# Patient Record
Sex: Male | Born: 1975
Health system: Southern US, Community
[De-identification: ages and names within clinical notes are randomized; demographics above are authoritative.]

## PROBLEM LIST (undated history)

## (undated) DIAGNOSIS — G4730 Sleep apnea, unspecified: Secondary | ICD-10-CM

## (undated) HISTORY — PX: ANKLE SURGERY: SHX546

---

## 2000-06-18 ENCOUNTER — Emergency Department (HOSPITAL_COMMUNITY): Admission: EM | Admit: 2000-06-18 | Discharge: 2000-06-18 | Payer: Self-pay | Admitting: Emergency Medicine

## 2010-11-27 ENCOUNTER — Emergency Department (HOSPITAL_COMMUNITY)
Admission: EM | Admit: 2010-11-27 | Discharge: 2010-11-28 | Payer: Self-pay | Source: Home / Self Care | Admitting: Emergency Medicine

## 2011-02-19 LAB — DIFFERENTIAL
Basophils Absolute: 0 10*3/uL (ref 0.0–0.1)
Eosinophils Relative: 1 % (ref 0–5)
Lymphocytes Relative: 18 % (ref 12–46)
Neutro Abs: 9.9 10*3/uL — ABNORMAL HIGH (ref 1.7–7.7)

## 2011-02-19 LAB — URINALYSIS, ROUTINE W REFLEX MICROSCOPIC
Bilirubin Urine: NEGATIVE
Ketones, ur: NEGATIVE mg/dL
Urobilinogen, UA: 1 mg/dL (ref 0.0–1.0)

## 2011-02-19 LAB — BASIC METABOLIC PANEL
BUN: 17 mg/dL (ref 6–23)
CO2: 24 mEq/L (ref 19–32)
Creatinine, Ser: 1.24 mg/dL (ref 0.4–1.5)
GFR calc Af Amer: >60 mL/min (ref >60)
Glucose, Bld: 98 mg/dL (ref 70–99)
Potassium: 3.8 mEq/L (ref 3.5–5.1)
Sodium: 135 mEq/L (ref 135–145)

## 2011-02-19 LAB — CBC
HCT: 47 % (ref 39.0–52.0)
MCHC: 35.3 g/dL (ref 30.0–36.0)
RDW: 12.4 % (ref 11.5–15.5)

## 2013-09-01 ENCOUNTER — Encounter (HOSPITAL_BASED_OUTPATIENT_CLINIC_OR_DEPARTMENT_OTHER): Payer: Self-pay | Admitting: *Deleted

## 2013-09-01 ENCOUNTER — Emergency Department (HOSPITAL_BASED_OUTPATIENT_CLINIC_OR_DEPARTMENT_OTHER)
Admission: EM | Admit: 2013-09-01 | Discharge: 2013-09-01 | Disposition: A | Discharge status: Home / Self Care | Payer: No Typology Code available for payment source | Attending: Emergency Medicine | Admitting: Emergency Medicine

## 2013-09-01 DIAGNOSIS — S336XXA Sprain of sacroiliac joint, initial encounter: Secondary | ICD-10-CM | POA: Insufficient documentation

## 2013-09-01 DIAGNOSIS — S139XXA Sprain of joints and ligaments of unspecified parts of neck, initial encounter: Secondary | ICD-10-CM | POA: Insufficient documentation

## 2013-09-01 DIAGNOSIS — F17200 Nicotine dependence, unspecified, uncomplicated: Secondary | ICD-10-CM | POA: Insufficient documentation

## 2013-09-01 DIAGNOSIS — Y92410 Unspecified street and highway as the place of occurrence of the external cause: Secondary | ICD-10-CM | POA: Insufficient documentation

## 2013-09-01 DIAGNOSIS — S161XXA Strain of muscle, fascia and tendon at neck level, initial encounter: Secondary | ICD-10-CM

## 2013-09-01 DIAGNOSIS — S39012A Strain of muscle, fascia and tendon of lower back, initial encounter: Secondary | ICD-10-CM

## 2013-09-01 DIAGNOSIS — Y9389 Activity, other specified: Secondary | ICD-10-CM | POA: Insufficient documentation

## 2013-09-01 MED ORDER — CYCLOBENZAPRINE HCL 10 MG PO TABS: 10.0000 mg | ORAL_TABLET | Freq: Two times a day (BID) | ORAL | Status: DC | PRN

## 2013-09-01 MED ORDER — IBUPROFEN 800 MG PO TABS: 800.0000 mg | ORAL_TABLET | Freq: Three times a day (TID) | ORAL | Status: DC
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
## 2013-09-15 ENCOUNTER — Encounter (HOSPITAL_BASED_OUTPATIENT_CLINIC_OR_DEPARTMENT_OTHER): Payer: Self-pay | Admitting: Emergency Medicine

## 2013-09-15 ENCOUNTER — Emergency Department (HOSPITAL_BASED_OUTPATIENT_CLINIC_OR_DEPARTMENT_OTHER): Payer: No Typology Code available for payment source

## 2013-09-15 ENCOUNTER — Emergency Department (HOSPITAL_BASED_OUTPATIENT_CLINIC_OR_DEPARTMENT_OTHER)
Admission: EM | Admit: 2013-09-15 | Discharge: 2013-09-16 | Disposition: A | Discharge status: Home / Self Care | Payer: No Typology Code available for payment source | Attending: Emergency Medicine | Admitting: Emergency Medicine

## 2013-09-15 DIAGNOSIS — M5432 Sciatica, left side: Secondary | ICD-10-CM

## 2013-09-15 DIAGNOSIS — M533 Sacrococcygeal disorders, not elsewhere classified: Secondary | ICD-10-CM | POA: Insufficient documentation

## 2013-09-15 DIAGNOSIS — F17200 Nicotine dependence, unspecified, uncomplicated: Secondary | ICD-10-CM | POA: Insufficient documentation

## 2013-09-15 DIAGNOSIS — M5430 Sciatica, unspecified side: Secondary | ICD-10-CM | POA: Insufficient documentation

## 2013-09-15 DIAGNOSIS — G8911 Acute pain due to trauma: Secondary | ICD-10-CM | POA: Insufficient documentation
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
## 2013-09-16 MED ORDER — NAPROXEN 250 MG PO TABS
ORAL_TABLET | ORAL | Status: AC
  Filled 2013-09-16: qty 2

## 2013-09-16 MED ORDER — HYDROCODONE-ACETAMINOPHEN 5-325 MG PO TABS
ORAL_TABLET | ORAL | Status: AC
  Filled 2013-09-16: qty 2
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
## 2013-09-30 ENCOUNTER — Ambulatory Visit (INDEPENDENT_AMBULATORY_CARE_PROVIDER_SITE_OTHER): Payer: Self-pay | Admitting: Family Medicine

## 2013-09-30 ENCOUNTER — Encounter: Payer: Self-pay | Admitting: Family Medicine

## 2013-09-30 VITALS — BP 175/105 | HR 102 | Ht 74.0 in | Wt 330.0 lb

## 2013-09-30 DIAGNOSIS — M545 Low back pain: Secondary | ICD-10-CM

## 2013-09-30 MED ORDER — HYDROCODONE-ACETAMINOPHEN 5-325 MG PO TABS: 1.0000 | ORAL_TABLET | Freq: Four times a day (QID) | ORAL | Status: DC | PRN

## 2013-09-30 MED ORDER — PREDNISONE (PAK) 10 MG PO TABS: ORAL_TABLET | ORAL | Status: DC
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
## 2013-10-01 ENCOUNTER — Encounter: Payer: Self-pay | Admitting: Family Medicine

## 2013-10-01 DIAGNOSIS — M545 Low back pain: Secondary | ICD-10-CM | POA: Insufficient documentation
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
## 2013-10-07 ENCOUNTER — Telehealth: Payer: Self-pay | Admitting: Family Medicine

## 2013-10-08 ENCOUNTER — Other Ambulatory Visit: Payer: Self-pay | Admitting: *Deleted

## 2013-10-08 MED ORDER — TRAMADOL HCL 50 MG PO TABS: 50.0000 mg | ORAL_TABLET | Freq: Three times a day (TID) | ORAL | Status: DC | PRN

## 2013-10-08 NOTE — Telephone Encounter (Signed)
He could try Tramadol 50mg  1 tab every 8 hours as needed for severe pain, #90 with 0 refills.

## 2013-11-11 ENCOUNTER — Ambulatory Visit (INDEPENDENT_AMBULATORY_CARE_PROVIDER_SITE_OTHER): Payer: Self-pay | Admitting: Family Medicine

## 2013-11-11 VITALS — BP 132/87 | HR 94 | Ht 74.0 in | Wt 330.0 lb

## 2013-11-11 DIAGNOSIS — M545 Low back pain: Secondary | ICD-10-CM

## 2013-11-12 ENCOUNTER — Encounter: Payer: Self-pay | Admitting: Family Medicine
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
## 2014-01-12 ENCOUNTER — Ambulatory Visit (INDEPENDENT_AMBULATORY_CARE_PROVIDER_SITE_OTHER): Payer: Self-pay | Admitting: Family Medicine

## 2014-01-12 ENCOUNTER — Encounter: Payer: Self-pay | Admitting: Family Medicine

## 2014-01-12 VITALS — BP 157/97 | HR 101 | Ht 74.0 in | Wt 330.0 lb

## 2014-01-12 DIAGNOSIS — M545 Low back pain, unspecified: Secondary | ICD-10-CM

## 2014-01-15 ENCOUNTER — Encounter: Payer: Self-pay | Admitting: Family Medicine
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
## 2017-09-03 ENCOUNTER — Emergency Department
Admission: EM | Admit: 2017-09-03 | Discharge: 2017-09-03 | Disposition: A | Discharge status: Home / Self Care | Payer: Self-pay | Attending: Emergency Medicine | Admitting: Emergency Medicine

## 2017-09-03 ENCOUNTER — Emergency Department: Payer: Self-pay

## 2017-09-03 ENCOUNTER — Encounter: Payer: Self-pay | Admitting: Medical Oncology

## 2017-09-03 ENCOUNTER — Encounter: Payer: Self-pay | Admitting: Surgery

## 2017-09-03 DIAGNOSIS — S82839A Other fracture of upper and lower end of unspecified fibula, initial encounter for closed fracture: Secondary | ICD-10-CM | POA: Insufficient documentation

## 2017-09-03 DIAGNOSIS — Z791 Long term (current) use of non-steroidal anti-inflammatories (NSAID): Secondary | ICD-10-CM | POA: Insufficient documentation

## 2017-09-03 DIAGNOSIS — Y999 Unspecified external cause status: Secondary | ICD-10-CM | POA: Insufficient documentation

## 2017-09-03 DIAGNOSIS — F17210 Nicotine dependence, cigarettes, uncomplicated: Secondary | ICD-10-CM | POA: Insufficient documentation

## 2017-09-03 DIAGNOSIS — W010XXA Fall on same level from slipping, tripping and stumbling without subsequent striking against object, initial encounter: Secondary | ICD-10-CM | POA: Insufficient documentation

## 2017-09-03 DIAGNOSIS — Y93H2 Activity, gardening and landscaping: Secondary | ICD-10-CM | POA: Insufficient documentation

## 2017-09-03 DIAGNOSIS — Y9289 Other specified places as the place of occurrence of the external cause: Secondary | ICD-10-CM | POA: Insufficient documentation

## 2017-09-03 DIAGNOSIS — S8254XA Nondisplaced fracture of medial malleolus of right tibia, initial encounter for closed fracture: Secondary | ICD-10-CM | POA: Insufficient documentation

## 2017-09-03 MED ORDER — OXYCODONE-ACETAMINOPHEN 5-325 MG PO TABS
1.0000 | ORAL_TABLET | Freq: Once | ORAL | Status: AC
  Administered 2017-09-03: 1 via ORAL
  Filled 2017-09-03: qty 1

## 2017-09-03 MED ORDER — OXYCODONE-ACETAMINOPHEN 7.5-325 MG PO TABS: 1.0000 | ORAL_TABLET | Freq: Four times a day (QID) | ORAL | 0 refills | Status: AC | PRN

## 2017-09-03 MED ORDER — FENTANYL CITRATE (PF) 100 MCG/2ML IJ SOLN: 50.0000 ug | Freq: Once | INTRAMUSCULAR | Status: DC

## 2017-09-03 NOTE — Discharge Instructions (Signed)
°  IMPRESSION:  1. Comminuted oblique fracture through the distal fibular  metaphysis.  2. Comminuted transverse fracture involving the medial malleolus.  3. Avulsion fracture arising from the anterior aspect of the distal  tibia.  4. Very slight lateral subluxation of the talus relative to the  tibial plafond to.

## 2017-09-03 NOTE — ED Triage Notes (Signed)
Pt was doing yard work when he twisted his rt ankle.
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
## 2017-09-03 NOTE — ED Provider Notes (Signed)
[facility] Emergency Department Provider Note  ____________________________________________  Time seen: Approximately 12:46 PM  I have reviewed the triage vital signs and the nursing notes.   HISTORY  Chief Complaint Ankle Pain    HPI Tyler Briggs is a 41 y.o. male that presents to the emergency department for evaluation of right ankle pain after falling today. Patient states that his foot got stuck in a hole while doing yard work and twisted. He heard a pop. He has not been able to bear weight since fall. He did not hit his head or lose consciousness. No numbness, tingling.   History reviewed. No pertinent past medical history.  Patient Active Problem List   Diagnosis Date Noted  . Low back pain 10/01/2013    No past surgical history on file.  Prior to Admission medications   Medication Sig Start Date End Date Taking? Authorizing Provider  cyclobenzaprine (FLEXERIL) 10 MG tablet Take 1 tablet (10 mg total) by mouth 2 (two) times daily as needed for muscle spasms. 09/01/13   Pricilla Loveless, MD  HYDROcodone-acetaminophen (NORCO/VICODIN) 5-325 MG per tablet Take 1 tablet by mouth every 6 (six) hours as needed for pain. 09/30/13   Hudnall, Azucena Fallen, MD  ibuprofen (ADVIL,MOTRIN) 800 MG tablet Take 1 tablet (800 mg total) by mouth 3 (three) times daily. 09/01/13   Pricilla Loveless, MD  oxyCODONE-acetaminophen (PERCOCET) 7.5-325 MG tablet Take 1 tablet by mouth every 6 (six) hours as needed for severe pain. 09/03/17 09/06/17  Enid Derry, PA-C  predniSONE (STERAPRED UNI-PAK) 10 MG tablet 6 tabs po day 1, 5 tabs po day 2, 4 tabs po day 3, 3 tabs po day 4, 2 tabs po day 5, 1 tab po day 6 09/30/13   Hudnall, Azucena Fallen, MD  traMADol (ULTRAM) 50 MG tablet Take 1 tablet (50 mg total) by mouth every 8 (eight) hours as needed for pain. 10/08/13   Lenda Kelp, MD    Allergies Patient has no known allergies.  Family History  Problem Relation Age of Onset  .  Sudden death Mother   . Heart attack Mother   . Hypertension Father   . Hyperlipidemia Neg Hx   . Diabetes Neg Hx     Social History Social History  Substance Use Topics  . Smoking status: Current Every Day Smoker    Packs/day: 1.00    Types: Cigarettes  . Smokeless tobacco: Never Used  . Alcohol use No     Review of Systems  Constitutional: No fever/chills Cardiovascular: No chest pain. Respiratory: No SOB. Gastrointestinal: No abdominal pain.  No nausea, no vomiting.  Musculoskeletal: Positive for ankle pain. Skin: Negative for rash, abrasions, lacerations, ecchymosis. Neurological: Negative for headaches, numbness or tingling   ____________________________________________   PHYSICAL EXAM:  VITAL SIGNS: ED Triage Vitals [09/03/17 1158]  Enc Vitals Group     BP 115/70     Pulse Rate 75     Resp 18     Temp 97.8 F (36.6 C)     Temp Source Oral     SpO2 99 %     Weight (!) 310 lb (140.6 kg)     Height  (1.88 m)     Head Circumference      Peak Flow      Pain Score 10     Pain Loc      Pain Edu?      Excl. in GC?      Constitutional: Alert and oriented.  Well appearing and in no acute distress. Eyes: Conjunctivae are normal. PERRL. EOMI. Head: Atraumatic. ENT:      Ears:      Nose: No congestion/rhinnorhea.      Mouth/Throat: Mucous membranes are moist.  Neck: No stridor. Cardiovascular: Normal rate, regular rhythm.  Good peripheral circulation. Palpable dorsalis pedis pulses. Respiratory: Normal respiratory effort without tachypnea or retractions. Lungs CTAB. Good air entry to the bases with no decreased or absent breath sounds. Musculoskeletal: Moderate swelling and bruising to right ankle. No range of motion of ankle due to pain. Compartments are soft. Neurologic:  Normal speech and language. No gross focal neurologic deficits are appreciated.  Skin:  Skin is warm, dry and intact. No rash  noted.   ____________________________________________   LABS (all labs ordered are listed, but only abnormal results are displayed)  Labs Reviewed - No data to display ____________________________________________  EKG   ____________________________________________  RADIOLOGY Lexine Baton, personally viewed and evaluated these images (plain radiographs) as part of my medical decision making, as well as reviewing the written report by the radiologist.  Dg Ankle Complete Right  Result Date: 09/03/2017 CLINICAL DATA:  41 year old who sustained a twisting injury to the right ankle while working in his yard at home earlier today. Initial encounter. EXAM: RIGHT ANKLE - COMPLETE 3+ VIEW COMPARISON:  None. FINDINGS: Comminuted oblique fracture through the distal fibular metaphysis. Comminuted transverse fracture involving the medial malleolus. Avulsion fracture arising from the anterior aspect of the distal tibia. No posterior malleolar fracture is visible. Very slight lateral subluxation of the talus relative to the tibial plafond. Large joint effusion/hemarthrosis. Small plantar calcaneal spur. Small enthesopathic spur at the insertion of the Achilles tendon on the calcaneus. IMPRESSION: 1. Comminuted oblique fracture through the distal fibular metaphysis. 2. Comminuted transverse fracture involving the medial malleolus. 3. Avulsion fracture arising from the anterior aspect of the distal tibia. 4. Very slight lateral subluxation of the talus relative to the tibial plafond to. Electronically Signed   By: Hulan Saas M.D.   On: 09/03/2017 12:44    ____________________________________________    PROCEDURES  Procedure(s) performed:    Procedures    Medications  oxyCODONE-acetaminophen (PERCOCET/ROXICET) 5-325 MG per tablet 1 tablet (1 tablet Oral Given 09/03/17 1428)     ____________________________________________   INITIAL IMPRESSION / ASSESSMENT AND PLAN / ED  COURSE  Pertinent labs & imaging results that were available during my care of the patient were reviewed by me and considered in my medical decision making (see chart for details).  Review of the [place] CSRS was performed in accordance of the [facility] prior to dispensing any controlled drugs.  Patient presented to the emergency department for evaluation of right ankle pain after fall. Signs and exam are reassuring. X-ray consistent with distal fibular fracture, medial malleolus fracture, anterior tibial avulsion fracture, and subluxation of talus. Foot is neurovascularly intact. Dr. Joice Lofts was consulted and recommended surgery today. Patient is refusing surgery today and talked to Dr. Joice Lofts on the phone. Dr. Joice Lofts recommended that ankle be placed in a splint and to follow up with him in clinic next week. Ankle was splinted while knee was in 90 degrees flexion and holding the great toe. Crutches were given. Patient was instructed not to bear any weight on that foot.  Patient will be discharged home with prescriptions for Percocet. Patient is to follow up with orthopedics as directed. Patient is given ED precautions to return to the ED for any worsening or new symptoms.  ____________________________________________  FINAL CLINICAL IMPRESSION(S) / ED DIAGNOSES  Final diagnoses:  Closed fracture of distal end of fibula, unspecified fracture morphology, initial encounter  Nondisplaced fracture of medial malleolus of right tibia, initial encounter for closed fracture      NEW MEDICATIONS STARTED DURING THIS VISIT:  Discharge Medication List as of 09/03/2017  3:12 PM          This chart was dictated using voice recognition software/Dragon. Despite best efforts to proofread, errors can occur which can change the meaning. Any change was purely unintentional.    Enid Derry, PA-C 09/03/17 1606    Sharyn Creamer, MD 09/03/17 [phone]

## 2017-09-03 NOTE — ED Notes (Signed)
Loreta Ave, PA in with pt at this time.

## 2017-09-03 NOTE — ED Notes (Signed)
Loreta Ave, PA has told the nurse to wait on drawing pt's blood and IV start. Pt is not wanting to have surgery done today.

## 2018-10-25 IMAGING — DX DG ANKLE COMPLETE 3+V*R*
3 series · 3 of 3 positions shown · non-contrast
Comparison: None.

CLINICAL DATA: 41-year-old who sustained a twisting injury to the
right ankle while working in his yard at home earlier today. Initial
encounter.

EXAM:
RIGHT ANKLE - COMPLETE 3+ VIEW

[ankle ap]
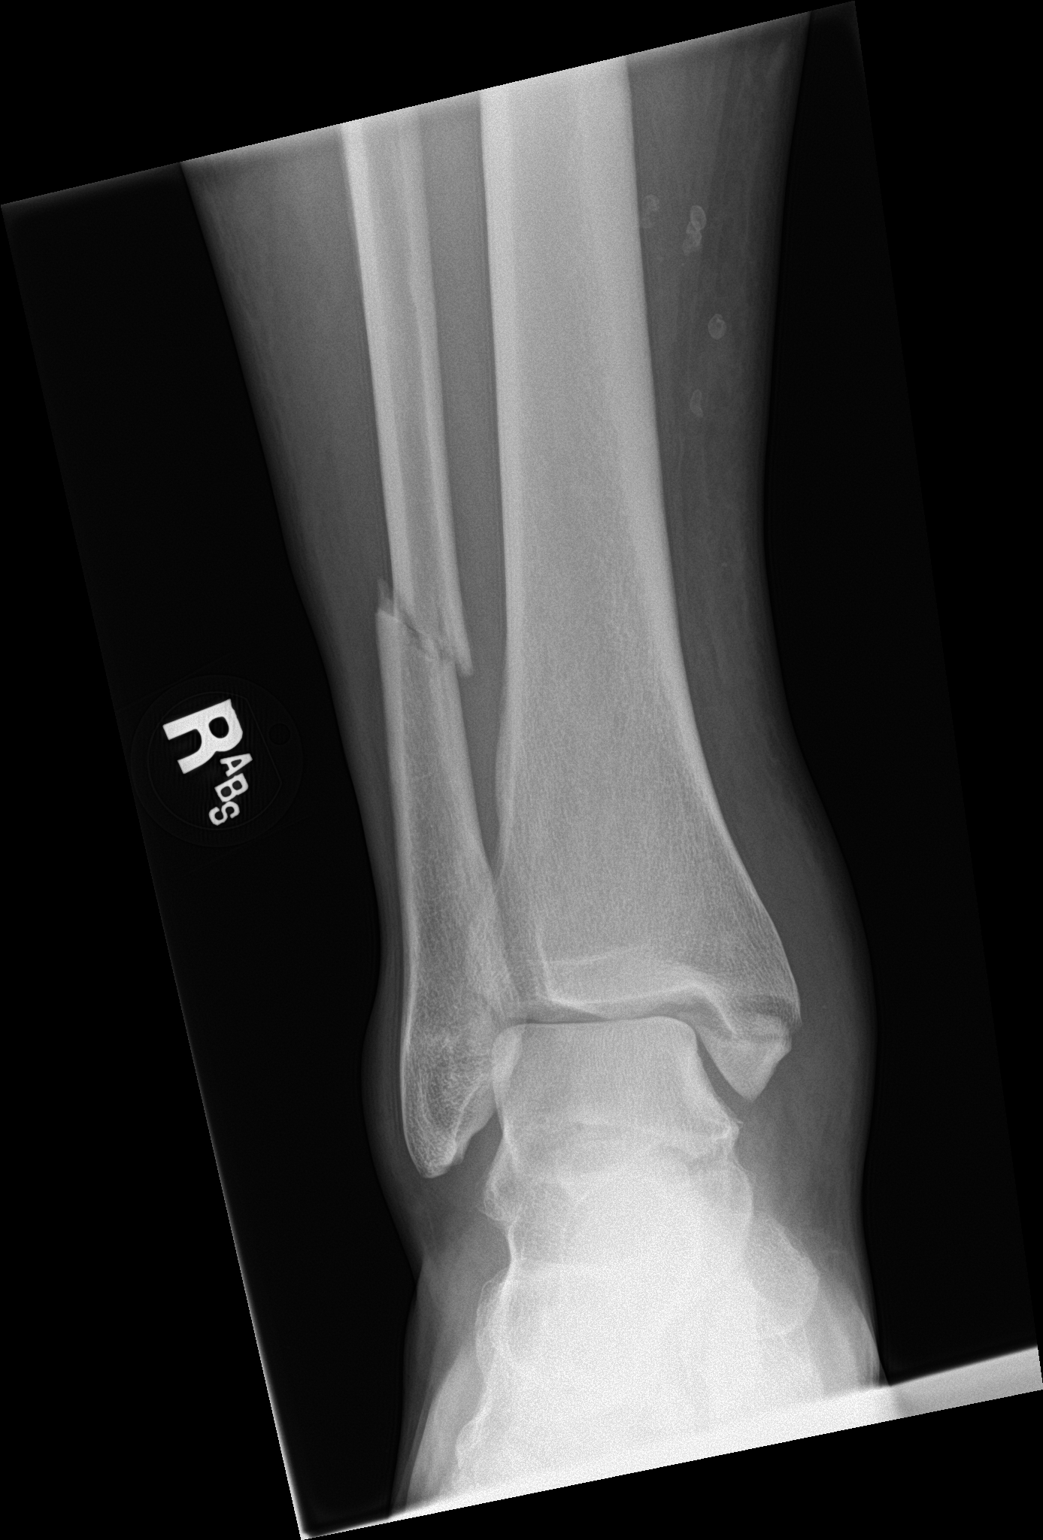

[ankle obl]
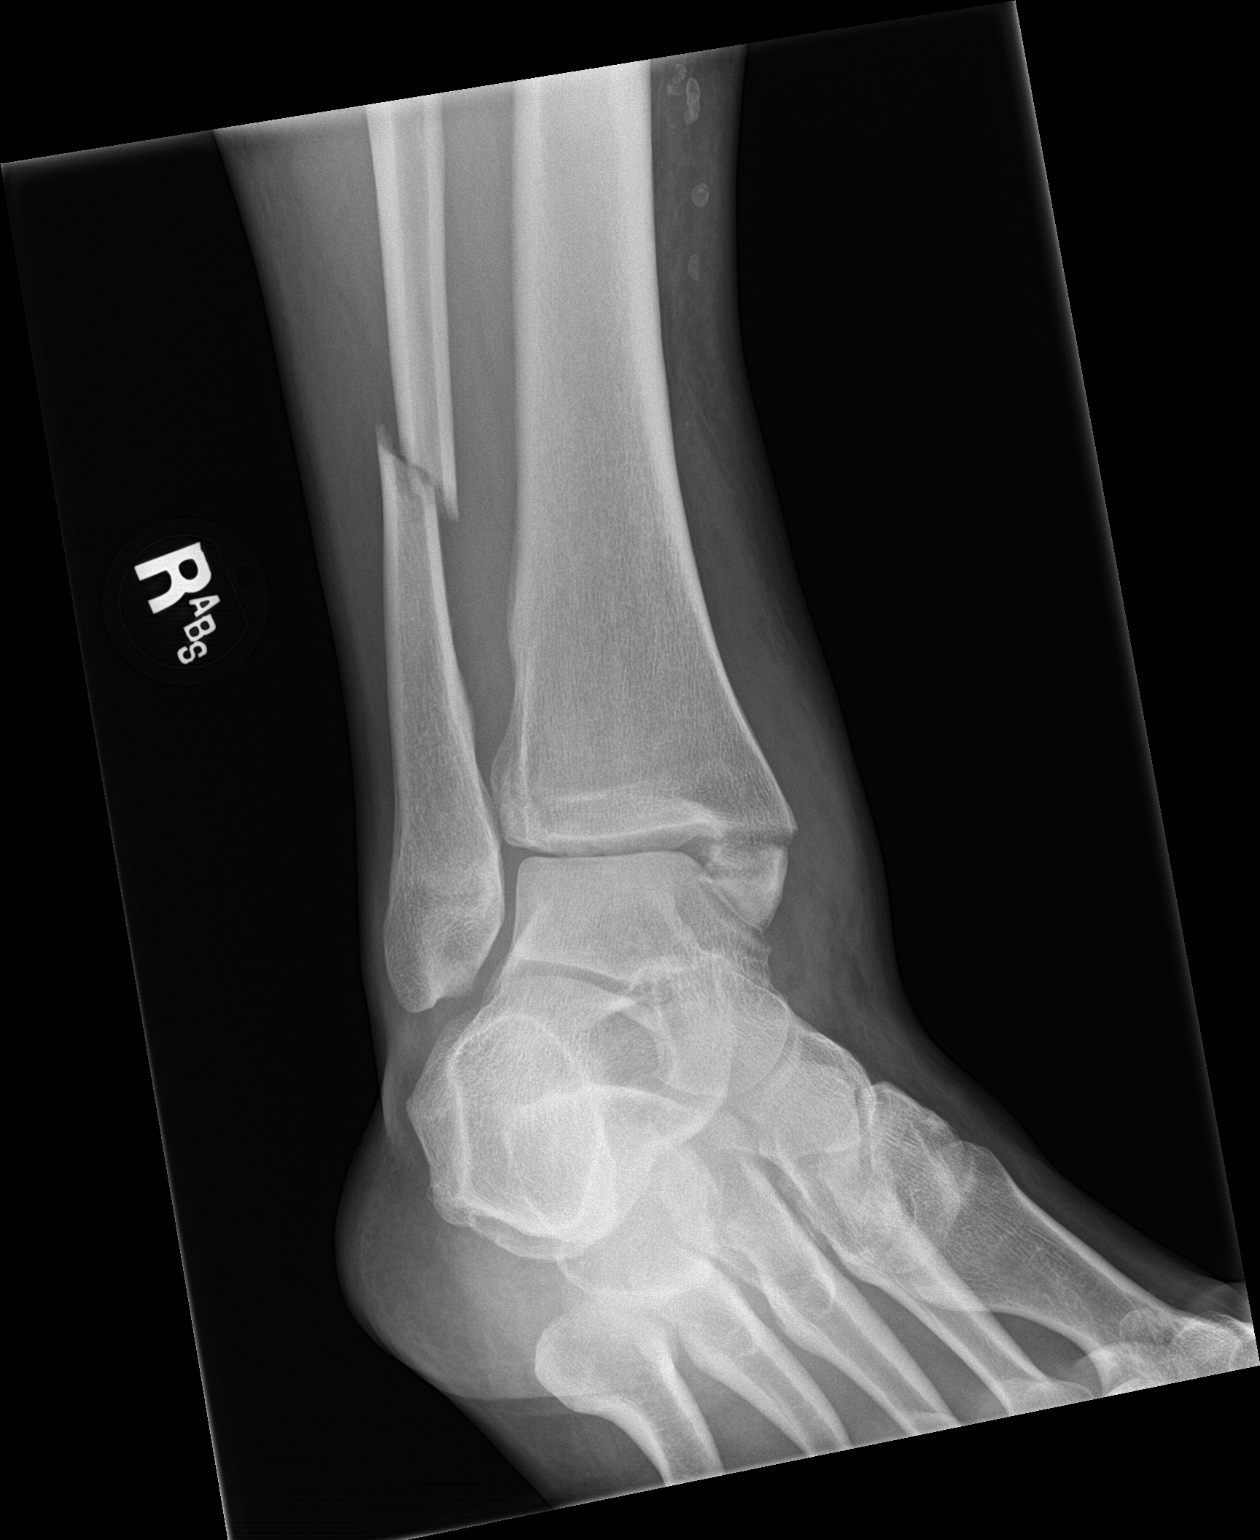

[ankle lat]
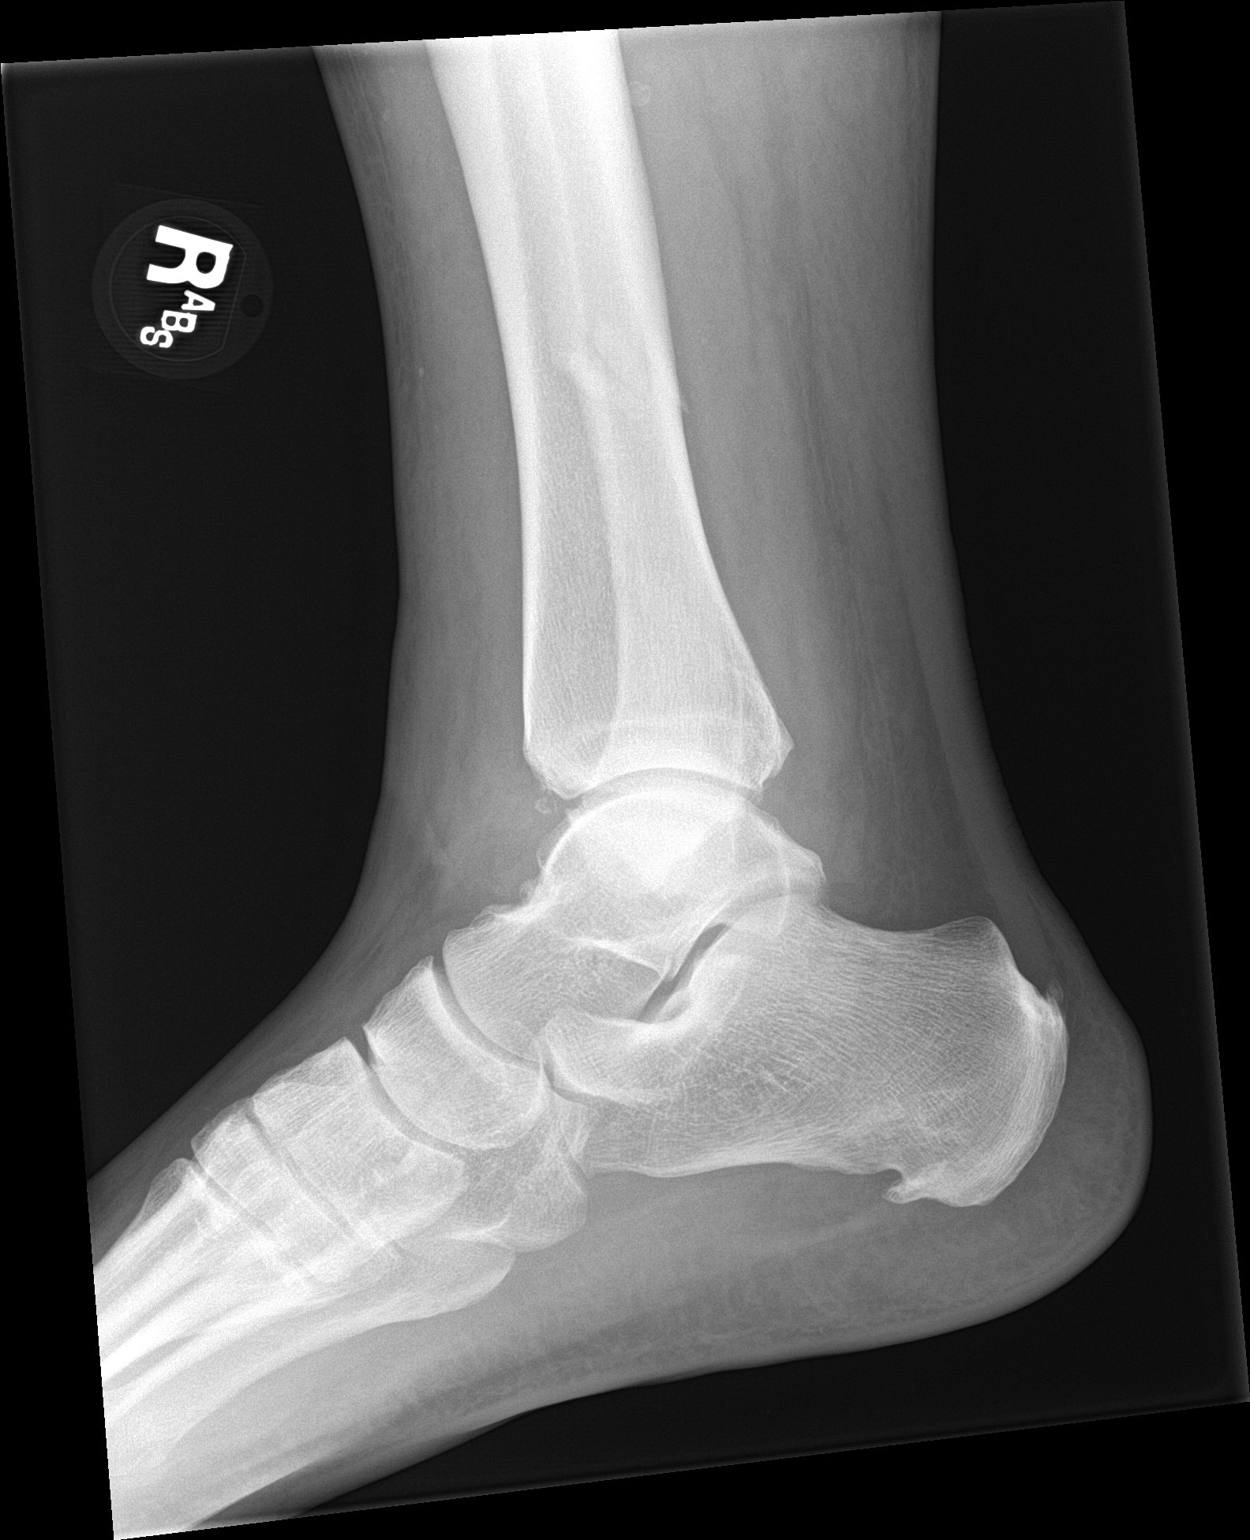

[3 of 3 positions shown; findings below may reference images not displayed]

FINDINGS: Comminuted oblique fracture through the distal fibular metaphysis.
Comminuted transverse fracture involving the medial malleolus.
Avulsion fracture arising from the anterior aspect of the distal
tibia. No posterior malleolar fracture is visible. Very slight
lateral subluxation of the talus relative to the tibial plafond.
Large joint effusion/hemarthrosis.

Small plantar calcaneal spur. Small enthesopathic spur at the
insertion of the Achilles tendon on the calcaneus.
IMPRESSION: 1. Comminuted oblique fracture through the distal fibular
metaphysis.
2. Comminuted transverse fracture involving the medial malleolus.
3. Avulsion fracture arising from the anterior aspect of the distal
tibia.
4. Very slight lateral subluxation of the talus relative to the
tibial plafond to.

## 2019-10-20 DIAGNOSIS — Z0000 Encounter for general adult medical examination without abnormal findings: Secondary | ICD-10-CM | POA: Diagnosis not present

## 2019-10-20 DIAGNOSIS — R635 Abnormal weight gain: Secondary | ICD-10-CM | POA: Diagnosis not present

## 2019-10-20 DIAGNOSIS — R0683 Snoring: Secondary | ICD-10-CM | POA: Diagnosis not present

## 2019-10-27 ENCOUNTER — Encounter: Payer: Self-pay | Admitting: Neurology

## 2019-10-28 ENCOUNTER — Ambulatory Visit (INDEPENDENT_AMBULATORY_CARE_PROVIDER_SITE_OTHER): Payer: 59 | Admitting: Neurology

## 2019-10-28 ENCOUNTER — Other Ambulatory Visit: Payer: Self-pay

## 2019-10-28 ENCOUNTER — Encounter: Payer: Self-pay | Admitting: Neurology

## 2019-10-28 DIAGNOSIS — R490 Dysphonia: Secondary | ICD-10-CM | POA: Diagnosis not present

## 2019-10-28 DIAGNOSIS — Z716 Tobacco abuse counseling: Secondary | ICD-10-CM | POA: Diagnosis not present

## 2019-10-28 DIAGNOSIS — G4719 Other hypersomnia: Secondary | ICD-10-CM | POA: Diagnosis not present

## 2019-10-28 DIAGNOSIS — G4730 Sleep apnea, unspecified: Secondary | ICD-10-CM

## 2019-10-28 DIAGNOSIS — G4710 Hypersomnia, unspecified: Secondary | ICD-10-CM | POA: Diagnosis not present

## 2019-10-28 DIAGNOSIS — J40 Bronchitis, not specified as acute or chronic: Secondary | ICD-10-CM | POA: Diagnosis not present

## 2019-10-28 NOTE — Progress Notes (Signed)
SLEEP MEDICINE CLINIC    Provider:  Larey Seat, MD  Primary Care Physician:  Jani Gravel, [address] [place] [address]     Referring Provider: Jani Gravel, [address] [place],  [place] [address]          Chief Complaint according to patient   Patient presents with:    . New Patient (Initial Visit)     pt with wife, rm 3. pt states that his wife says he snores in sleep and she has witnessed apnea events.  He wakes up frequently during the night never had SS. His dad had OSA.      HISTORY OF PRESENT ILLNESS:  Tyler Briggs is a 43 year old caucasian and Native American  male patient seen on 10/28/2019    Chief concern according to patient :    I have the pleasure of seeing Tyler Briggs today, a right-handed  male patient with a possible sleep disorder.  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 He no longer drives a truck, he has a Media planner but did not renew several years ago. His wife reports sleep- position indepenend thunderous snoring and sleep apneas. He can sleep better reclined. He has nocturia and a dry mouth. He is hoarse and coughing today.    The patient never had a sleep study.    Sleep relevant medical history: Nocturia -4-5 , Night terrors in childhood, daughter is a sleep walker- age 75-6   Family medical /sleep history: father had a CPAP with OSA,died at age 31 of a suicide. Sleep walkers: daughter is a sleep walker- age 76-6.   Social history: Patient is working as Clinical research associate and lives in a household with 6 persons. Family status is married  with 4 children, the patient grow up in a family with 10 siblings, 3 of them half sibling. The patient currently works in daytime, but works often late.  Pets are present,  one dog- not sleeping in the bedroom.  Tobacco use- for 30 years  1 ppd.   ETOH use: seldomly, Caffeine intake in form of Coffee( 3-4/ week) Soda( [place] . Daily 3-4 ) Tea ( seldomly) , but  monster energy drinks. Regular exercise: none .   Hobbies :none .     Sleep habits are as follows: The patient's dinner time is between 6 PM. The patient goes to bed at 9-11 PM and continues to sleep for 2 hours, wakes for the first of many bathroom breaks, the first time at 1 AM.   The preferred sleep position is right sided, with the support of 2 pillows.  Dreams are reportedly rare. 5.15  AM is the usual rise time. The patient wakes up with an alarm.  He reports not feeling refreshed or restored in AM, with symptoms such as dry mouth , no morning headaches, and residual fatigue.  Naps are taken frequently,  Dozing frequently, lasting from 10 to 25 minutes and are more refreshing than nocturnal sleep.    Review of Systems: Out of a complete 14 system review, the patient complains of only the following symptoms, and all other reviewed systems are negative.:  Fatigue, sleepiness , snoring, fragmented sleep,Nocturia.    How likely are you to doze in  the following situations: 0 = not likely, 1 = slight chance, 2 = moderate chance, 3 = high chance   Sitting and Reading? Watching Television? Sitting inactive in a public place (theater or meeting)? As a passenger in a car for an hour without a break? Lying down in the afternoon when circumstances permit? Sitting and talking to someone? Sitting quietly after lunch without alcohol? In a car, while stopped for a few minutes in traffic?   Total = 18/ 24 points   FSS endorsed at 40/ 63 points.   Social History   Socioeconomic History  . Marital status: Single    Spouse name: Not on file  . Number of children: Not on file  . Years of education: Not on file  . Highest education level: Not on file  Occupational History  . Not on file  Social  Needs  . Financial resource strain: Not on file  . Food insecurity    Worry: Not on file    Inability: Not on file  . Transportation needs    Medical: Not on file    Non-medical: Not on file  Tobacco Use  . Smoking status: Current Every Day Smoker    Packs/day: 1.00    Types: Cigarettes  . Smokeless tobacco: Never Used  Substance and Sexual Activity  . Alcohol use: No  . Drug use: No  . Sexual activity: Not on file  Lifestyle  . Physical activity    Days per week: Not on file    Minutes per session: Not on file  . Stress: Not on file  Relationships  . Social Musician on phone: Not on file    Gets together: Not on file    Attends religious service: Not on file    Active member of club or organization: Not on file    Attends meetings of clubs or organizations: Not on file    Relationship status: Not on file  Other Topics Concern  . Not on file  Social History Narrative  . Not on file    Family History  Problem Relation Age of Onset  . Sudden death Mother   . Heart attack Mother   . Hypertension Father   . Hyperlipidemia Neg Hx   . Diabetes Neg Hx     No past medical history on file.  Past Surgical History:  Procedure Laterality Date  . ANKLE SURGERY       Physical exam:  Today's Vitals   10/28/19 0857  BP: (!) 152/91  Pulse: 93  Temp: 97.9 F (36.6 C)  Weight: (!) 371 lb (168.3 kg)  Height: 6' (1.829 m)   Body mass index is 50.32 kg/m.   Wt Readings from Last 3 Encounters:  10/28/19 (!) 371 lb (168.3 kg)  09/03/17 (!) 310 lb (140.6 kg)  01/12/14 (!) 330 lb (149.7 kg)     Ht Readings from Last 3 Encounters:  10/28/19 6' (1.829 m)  09/03/17 6\' 2"  (1.88 m)  01/12/14 6\' 2"  (1.88 m)      General: The patient is awake, alert and appears not in acute distress. The patient is well groomed. Head: Normocephalic, atraumatic. Neck is supple. Mallampati 4,  neck circumference:19 inches . Nasal airflow restricted.  Retrognathia is not seen.   Patient is able to hold his breath for 45 seconds plus.  Dental status:  Cardiovascular:  Regular rate and cardiac rhythm by pulse,  without distended neck veins. Respiratory: Lungs are clear to auscultation.  Skin:  Without evidence of ankle edema, or rash. Trunk: The patient's posture is erect.   Neurologic exam : The patient is awake and alert, oriented to place and time.   Memory subjective described as intact.  Attention span & concentration ability appears normal.  Speech is fluent,  without  dysarthria, dysphonia or aphasia.  Mood and affect are appropriate.   Cranial nerves: no loss of smell or taste reported  Pupils are equal and briskly reactive to light. Funduscopic exam deferred.   Extraocular movements in vertical and horizontal planes were intact and without nystagmus. No Diplopia. Visual fields by finger perimetry are intact. Hearing was intact to soft voice and finger rubbing.    Facial sensation intact to fine touch.  Facial motor strength is assymmetric but tongue is midline.  Left face formerly Bells palsy- non central facial droop and smaller eye opening.  Neck ROM : rotation, tilt and flexion extension were normal for age and shoulder shrug was symmetrical.    Motor exam:  Symmetric bulk, tone and ROM.   Normal tone without cog wheeling, symmetric grip strength . He is remarkably limber.  Sensory:  Fine touch, pinprick and vibration were tested  and  normal.  Proprioception tested in the upper extremities was normal. Coordination: Rapid alternating movements in the fingers/hands were of normal speed.  The Finger-to-nose maneuver was intact without evidence of ataxia, dysmetria or tremor.   Gait and station: Patient could rise unassisted from a seated position, walked without assistive device.  Stance is of normal width/ base and the patient turned with 3 steps.  Toe and heel walk were deferred.  Deep tendon reflexes: in the  upper and lower extremities are  symmetric and intact.  Babinski response was deferred.     After spending a total time of 40 minutes face to face and additional time for physical and neurologic examination, review of laboratory studies,  personal review of imaging studies, reports and results of other testing and review of referral information / records as far as provided in visit, I have established the following assessments:  1) This patient all the risk factors for OSA, including HTN, 2)Obesity to BMI 50 kg/m2 and high grade airway obstruction, nasal patency restriction. 3) He also is a smoker, has been hoarse and coughing. 4) OSA additionally very likely due to paternal history of OSA. Wife has observed snoring and apnea.    OSA is the likely cause of : A)  Excessive daytime sleepiness.  B) Nocturia.    My Plan is to proceed with:  1)  Attended SPLIT night sleep study -if not permitted, will use HST .  Has to be done before 12-09-2019    I would like to thank Pearson GrippeKim, James, MD [address] [place] [id number] for allowing me to meet with and to take care of this pleasant patient.   In short, Tyler Briggs is presenting with EDS, a symptom that can be attributed to untreated OSA, pulmonary capacity restriction, high risk for hypoxemia and hypercapnia.    I plan to follow up either personally or through our NP within 2 month.   CC: I will share my notes with PCP.  Electronically signed by: Melvyn Novasarmen Barbee Mamula, MD 10/28/2019 9:06 AM  [facility] Board certified by The [employer] Board of Sleep Medicine and Diplomate of the [employer] Academy of Sleep Medicine. Board certified In Neurology through the ABPN, Fellow of the [employer] Academy of Neurology.  Medical Director of [employer].

## 2019-11-29 ENCOUNTER — Ambulatory Visit (INDEPENDENT_AMBULATORY_CARE_PROVIDER_SITE_OTHER): Payer: 59 | Admitting: Neurology

## 2019-11-29 DIAGNOSIS — J40 Bronchitis, not specified as acute or chronic: Secondary | ICD-10-CM

## 2019-11-29 DIAGNOSIS — R0902 Hypoxemia: Secondary | ICD-10-CM

## 2019-11-29 DIAGNOSIS — G4733 Obstructive sleep apnea (adult) (pediatric): Secondary | ICD-10-CM | POA: Diagnosis not present

## 2019-11-29 DIAGNOSIS — G4710 Hypersomnia, unspecified: Secondary | ICD-10-CM

## 2019-11-29 DIAGNOSIS — R490 Dysphonia: Secondary | ICD-10-CM

## 2019-11-29 DIAGNOSIS — Z716 Tobacco abuse counseling: Secondary | ICD-10-CM

## 2019-11-29 DIAGNOSIS — G4730 Sleep apnea, unspecified: Secondary | ICD-10-CM

## 2019-11-29 DIAGNOSIS — G4719 Other hypersomnia: Secondary | ICD-10-CM

## 2019-12-09 ENCOUNTER — Encounter: Payer: Self-pay | Admitting: Neurology

## 2019-12-09 DIAGNOSIS — R0902 Hypoxemia: Secondary | ICD-10-CM | POA: Insufficient documentation

## 2019-12-09 NOTE — Procedures (Signed)
PATIENT'S NAME:  Tyler Briggs, Tuccillo DOB:      Jan 15, 1976      MR#:    [id number]     DATE OF RECORDING: 11/29/2019 REFERRING M.D.:  Jani Gravel, MD Study Performed:   Baseline Polysomnogram HISTORY:   Tyler Briggs is a right-handed male patient with exc3essive daytime sleepiness.  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  He no longer drives a truck, he has a Arts administrator but did not renew several years ago. His wife reports sleep- position independent thunderous snoring and sleep apneas. He can sleep better reclined. He has nocturia and a dry mouth. He is hoarse and coughing today.  The patient endorsed the Epworth Sleepiness Scale at 18/24 points.   The patient's weight 371 pounds with a height of 72 (inches), resulting in a BMI of 50.2 kg/m2. The patient's neck circumference measured 19 inches.  CURRENT MEDICATIONS: None   PROCEDURE:  This is a multichannel digital polysomnogram utilizing the Somnostar 11.2 system.  Electrodes and sensors were applied and monitored per AASM Specifications.   EEG, EOG, Chin and Limb EMG, were sampled at 200 Hz.  ECG, Snore and Nasal Pressure, Thermal Airflow, Respiratory Effort, CPAP Flow and Pressure, Oximetry was sampled at 50 Hz. Digital video and audio were recorded.      BASELINE STUDY: Lights Out was at 22:05 and Lights On at 05:00.  Total recording time (TRT) was 415 minutes, with a total sleep time (TST) of 334 minutes.  The patient's sleep latency was 41.5 minutes.  REM latency was 94 minutes.  The sleep efficiency was 80.5 %.     SLEEP ARCHITECTURE: WASO (Wake after sleep onset) was 39 minutes.  There were 12.5 minutes in Stage N1, 267 minutes Stage N2, 0 minutes Stage N3 and 54.5 minutes in Stage REM.  The percentage of Stage N1 was 3.7%, Stage N2 was 79.9%,  Stage N3 was 0% and Stage R (REM sleep) was 16.3%.   RESPIRATORY ANALYSIS:  There were a total of 489 respiratory events:  218 obstructive apneas, 3 central apneas and 2 mixed apneas with a total of 223 apneas and an apnea index (AI) of 40.1 /hour. There were 266 hypopneas with a hypopnea index of 47.8 /hour. The patient also had 0 respiratory event related arousals (RERAs).     The total APNEA/HYPOPNEA INDEX (AHI) was 87.8/hour and the total RESPIRATORY DISTURBANCE INDEX was 87.8 /hour.  67 events occurred in REM sleep and 459 events in NREM. The REM AHI was  73.8 /hour, versus a non-REM AHI of 90.6. The patient spent 173 minutes of total sleep time in the supine position and 161 minutes in non-supine. The supine AHI was 88.4 versus a non-supine AHI of 87.2.  OXYGEN SATURATION & C02:  The Wake baseline 02 saturation was 98%, with the lowest being SpO2 of  55%.  Time spent below 89% saturation equaled 319 minutes. The arousals were noted as: 44 were spontaneous, 0 were associated with PLMs, 433 were associated with respiratory events. The patient had a total of 0 Periodic Limb Movements.  The Periodic Limb Movement (PLM) index was 0 and the PLM Arousal index was 0/hour. Snoring was noted. EKG irregular rhythm.   IMPRESSION:  Severest Obstructive Sleep Apnea (OSA)at AHI of 87.8/h and associated with critical hypoxemia  during sleep with the lowest being SpO2 of 55%.  Time spent below 89% saturation equaled 319 minutes. Non-specific abnormal EKG   RECOMMENDATIONS:  1. Advise full-night, attended, CPAP titration study to optimize therapy. If this is not possible, order auto titration CPAP 5-10 cm water with 2 cm EPR and RV with NP after 30 days of use- ONO on CPAP to see if attended study remains necessary   I certify that I have reviewed the entire raw data recording prior to the issuance of this report in accordance with the Standards of Accreditation of the American Academy of Sleep Medicine  (AASM)   Melvyn Novas, MD Diplomat, American Board of Psychiatry and Neurology  Diplomat, American Board of Sleep Medicine Wellsite geologist, [employer] [employer]

## 2019-12-09 NOTE — Addendum Note (Signed)
Addended by: Larey Seat on: 12/09/2019 04:41 PM   Modules accepted: Orders

## 2019-12-10 ENCOUNTER — Telehealth: Payer: Self-pay | Admitting: Neurology

## 2019-12-10 NOTE — Telephone Encounter (Signed)

## 2019-12-10 NOTE — Telephone Encounter (Signed)
-----   Message from Larey Seat, MD sent at 12/09/2019  4:40 PM EST ----- IMPRESSION:   Severest Obstructive Sleep Apnea (OSA)at AHI of 87.8/h and  associated with critical hypoxemia during sleep with the lowest  being SpO2 of 55%.  Time spent below 89% saturation equaled 319 minutes.  Non-specific abnormal EKG    RECOMMENDATIONS:   1. Advise full-night, attended, CPAP titration study to optimize  therapy. If this is not possible, order auto titration CPAP 5-10  cm water with 2 cm EPR and RV with NP after 30 days of use- ONO  on CPAP to see if attended study remains necessary

## 2019-12-15 NOTE — Progress Notes (Signed)
Nurse Lewie Chamber: Correction - auto CPAP 5-20 cm water pressure window. I wrote 5-10 cm. CD. That's only if we can't get this patient in for an attended sleep study.

## 2020-01-12 ENCOUNTER — Other Ambulatory Visit (HOSPITAL_COMMUNITY)
Admission: RE | Admit: 2020-01-12 | Discharge: 2020-01-12 | Disposition: A | Discharge status: Home / Self Care | Payer: No Typology Code available for payment source | Source: Ambulatory Visit | Attending: Neurology | Admitting: Neurology

## 2020-01-12 DIAGNOSIS — Z01812 Encounter for preprocedural laboratory examination: Secondary | ICD-10-CM | POA: Diagnosis present

## 2020-01-12 DIAGNOSIS — Z20822 Contact with and (suspected) exposure to covid-19: Secondary | ICD-10-CM | POA: Insufficient documentation

## 2020-01-12 LAB — SARS CORONAVIRUS 2 (TAT 6-24 HRS): SARS Coronavirus 2: NEGATIVE

## 2020-01-15 ENCOUNTER — Other Ambulatory Visit: Payer: Self-pay

## 2020-01-15 ENCOUNTER — Ambulatory Visit (INDEPENDENT_AMBULATORY_CARE_PROVIDER_SITE_OTHER): Payer: 59 | Admitting: Neurology

## 2020-01-15 DIAGNOSIS — J40 Bronchitis, not specified as acute or chronic: Secondary | ICD-10-CM

## 2020-01-15 DIAGNOSIS — G4734 Idiopathic sleep related nonobstructive alveolar hypoventilation: Secondary | ICD-10-CM

## 2020-01-15 DIAGNOSIS — G4710 Hypersomnia, unspecified: Secondary | ICD-10-CM

## 2020-01-15 DIAGNOSIS — G4733 Obstructive sleep apnea (adult) (pediatric): Secondary | ICD-10-CM

## 2020-01-15 DIAGNOSIS — R0902 Hypoxemia: Secondary | ICD-10-CM

## 2020-01-15 DIAGNOSIS — Z716 Tobacco abuse counseling: Secondary | ICD-10-CM

## 2020-01-25 DIAGNOSIS — G4733 Obstructive sleep apnea (adult) (pediatric): Secondary | ICD-10-CM | POA: Insufficient documentation

## 2020-01-25 NOTE — Procedures (Signed)
PATIENT'S NAME:  Tyler Briggs, Tyler Briggs DOB:      08-18-1976      MR#:    [id number]     DATE OF RECORDING: 01/15/2020 REFERRING M.D.:  Jani Gravel, MD Study Performed:   Titration to positive airway pressure  HISTORY:  C Stapel is a right-handed male patient with excessive daytime sleepiness.   He has medical conditions of chronic tobacco use, morbid obesity, hypertension. His recent PSG confirmed a diagnosis of Severest Obstructive Sleep Apnea (OSA)at AHI of 87.8/h and associated with critical hypoxemia during sleep with the lowest being SpO2 of 55%.  Time spent below 89% saturation equaled 319 minutes(!).  The patient endorsed the Epworth Sleepiness Scale at 18 points.   The patient's weight 371 pounds with a height of 72 (inches), resulting in a BMI of 50.2 kg/m2. The patient's neck circumference measured 19 inches.  CURRENT MEDICATIONS: NONE   PROCEDURE:  This is a multichannel digital polysomnogram utilizing the SomnoStar 11.2 system.  Electrodes and sensors were applied and monitored per AASM Specifications.   EEG, EOG, Chin and Limb EMG, were sampled at 200 Hz.  ECG, Snore and Nasal Pressure, Thermal Airflow, Respiratory Effort, CPAP Flow and Pressure, Oximetry was sampled at 50 Hz. Digital video and audio were recorded.      The patient uses a FFM, ResMed F 20 in large. Therapy with CPAP was initiated at 5 cmH20 with heated humidity and pressure was advanced to 15 cmH20 but did not resolve hypopneas, apneas and desaturations. The technologist changed to BiPAP 16/12 and gradually increased to 25/20 cm water where the  AHI became 0.3/h for a total sleep time of 172 minutes.,   At a PAP pressure of 0 cmH20, there was a reduction of the AHI to 0 with improvement of sleep apnea.  Lights Out was at 21:49 and Lights On at 05:20. Total recording time (TRT) was 451.5 minutes, with a total sleep time (TST) of 392.5 minutes. The patient's sleep latency was 19.5 minutes. REM latency was 75 minutes.  The sleep  efficiency was 86.9 %.    SLEEP ARCHITECTURE: WASO (Wake after sleep onset) was 46.5 minutes.  There were 16 minutes in Stage N1, 180 minutes Stage N2, 43 minutes Stage N3 and 153.5 minutes in Stage REM.  The percentage of Stage N1 was 4.1%, Stage N2 was 45.9%, Stage N3 was 11.% and Stage R (REM sleep) was 39.1%.   RESPIRATORY ANALYSIS:  There was a total of 108 respiratory events: 0 obstructive apneas, 0 central apneas and 0 mixed apneas with a total of 0 apneas and an apnea index (AI) of 0 /hour. There were 108 hypopneas with a hypopnea index of 16.5/hour. The total APNEA/HYPOPNEA INDEX (AHI) was 16.5 /hour.  13 events occurred in REM sleep and 95 events in NREM. The REM AHI was 5.1 /hour versus a non-REM AHI of 23.8 /hour.  The patient spent 125 minutes of total sleep time in the supine position and 268 minutes in non-supine.  The supine AHI was 12.5/h, versus a non-supine AHI of 18.4/h.  OXYGEN SATURATION & C02:  The baseline 02 saturation was 94%, with the lowest being 74%. Time spent below 89% saturation equaled still 117 minutes.  The arousals were noted as: 16 were spontaneous, 0 were associated with PLMs, 49 were associated with respiratory events. The patient had a total of 0 Periodic Limb Movements. Snoring was noted. EKG appeared to reflect a paced rhythm.   DIAGNOSIS 1. Severe Obstructive Sleep Apnea did  not resolve under CPAP and was therefore treated with BiPAP at 15/20 cm water, reaching an AHI of 0.5/h.  2. Non-specific abnormal EKG  PLANS/RECOMMENDATIONS: The patient uses a FFM, ResMed F 20 in large, and will use BiPAP at 15/20 cm water pressure.  I will order the auto BiPAP machine and add an order for overnight pulse-oximetry while on BIPAP.    DISCUSSION: Rv with Np in 3 month with compliance documentation, residual AHI and Epworth score.    A follow up appointment will be scheduled in the Sleep Clinic at [facility].   Please call [phone] with any  questions.     I certify that I have reviewed the entire raw data recording prior to the issuance of this report in accordance with the Standards of Accreditation of the American Academy of Sleep Medicine (AASM)    01-25-2020 Melvyn Novas, M.D. Diplomat, Biomedical engineer of Psychiatry and Neurology  Diplomat, Biomedical engineer of Sleep Medicine Wellsite geologist, [employer] [employer]

## 2020-01-25 NOTE — Addendum Note (Signed)
Addended by: Melvyn Novas on: 01/25/2020 04:51 PM   Modules accepted: Orders

## 2020-01-25 NOTE — Progress Notes (Signed)
1. Severe Obstructive Sleep Apnea did not resolve under CPAP and was therefore treated with BiPAP at 15/20 cm water, reaching an AHI of 0.5/h.  2. Non-specific abnormal EKG  PLANS/RECOMMENDATIONS: The patient uses a FFM, ResMed F 20 in large, and will use BiPAP at 15/20 cm water pressure.  I will order the auto BiPAP machine and add an order for overnight pulse-oximetry while on BIPAP.    DISCUSSION: Revisit with NP in 3 month with compliance documentation, residual AHI and Epworth score.

## 2020-01-26 ENCOUNTER — Telehealth: Payer: Self-pay | Admitting: Neurology

## 2020-01-26 NOTE — Telephone Encounter (Signed)
-----   Message from Melvyn Novas, MD sent at 01/25/2020  4:51 PM EST ----- 1. Severe Obstructive Sleep Apnea did not resolve under CPAP and was therefore treated with BiPAP at 15/20 cm water, reaching an AHI of 0.5/h.  2. Non-specific abnormal EKG  PLANS/RECOMMENDATIONS: The patient uses a FFM, ResMed F 20 in large, and will use BiPAP at 15/20 cm water pressure.  I will order the auto BiPAP machine and add an order for overnight pulse-oximetry while on BIPAP.    DISCUSSION: Revisit with NP in 3 month with compliance documentation, residual AHI and Epworth score.

## 2020-01-26 NOTE — Telephone Encounter (Signed)
Called patient to discuss sleep study results. Pt answered and states that he will have to call back. Advised the patient to call back when ready for me to review with him.

## 2020-01-26 NOTE — Telephone Encounter (Signed)
Patient called back for their results   Patient states he is unavailable between 11am 11:30am  But any other time is good.

## 2020-01-27 NOTE — Telephone Encounter (Signed)
I called pt. I advised pt that Dr. Vickey Huger reviewed their sleep study results and found that pt was best treated on a BiPAP. Dr. Vickey Huger recommends that pt starts BiPAP at 15/20 cm water pressure. I reviewed PAP compliance expectations with the pt. Pt is agreeable to starting a CPAP. I advised pt that an order will be sent to a DME, [facility], and [facility] will call the pt within about one week after they file with the pt's insurance. [facility] will show the pt how to use the machine, fit for masks, and troubleshoot the CPAP if needed. A follow up appt was made for insurance purposes with Butch Penny, NP on May 5,2021 at 3 pm. Pt verbalized understanding to arrive 15 minutes early and bring their CPAP. A letter with all of this information in it will be mailed to the pt as a reminder. I verified with the pt that the address we have on file is correct. Pt verbalized understanding of results. Pt had no questions at this time but was encouraged to call back if questions arise. I have sent the order to [facility] and have received confirmation that they have received the order.

## 2020-02-21 ENCOUNTER — Encounter: Payer: Self-pay | Admitting: Neurology

## 2020-02-25 ENCOUNTER — Telehealth: Payer: Self-pay | Admitting: Neurology

## 2020-02-25 DIAGNOSIS — J40 Bronchitis, not specified as acute or chronic: Secondary | ICD-10-CM

## 2020-02-25 DIAGNOSIS — G4710 Hypersomnia, unspecified: Secondary | ICD-10-CM

## 2020-02-25 DIAGNOSIS — G4733 Obstructive sleep apnea (adult) (pediatric): Secondary | ICD-10-CM

## 2020-02-25 DIAGNOSIS — Z716 Tobacco abuse counseling: Secondary | ICD-10-CM

## 2020-02-25 NOTE — Telephone Encounter (Signed)
[facility] has sent the patient's ONO results   I will give them to Dr Dohmeier to review and contact the patient with her recommendations after they are resulted.

## 2020-02-25 NOTE — Telephone Encounter (Signed)
ONO Results for Tyler Briggs;  Tyler Briggs underwent a overnight pulse oximetry on room air by using his BiPAP.  Good overnight oximetry started on 14 March and ended on 23 February 2020 a total time of 6 hours 25 minutes was recorded.  I am not sure why this scratch over 2 nights.  The patient while on BiPAP had a nadir of 79% SPO2, and a 22.1/h oxygen desaturation index.  For a duration of 11 minutes and 12 seconds his oxygen level was under 88% and he would qualify for oxygen supplementation based on this.  The patient will have to return to the sleep lab to be titrated to oxygen while on BiPAP.  This is also meant to make sure that this is not an artifactual reading on a home Oximetry test.    Sincerely, Ruston Fedora MD

## 2020-02-26 ENCOUNTER — Encounter: Payer: Self-pay | Admitting: Neurology

## 2020-03-02 NOTE — Telephone Encounter (Signed)
Called the patient and reviewed the ONO results with him. I did make sure that he used the BiPAP machine the night of the study. I also questioned how the BiPAP was making him feel because the usage is minimal on the download. He states that it doesn't feel like it did in the sleep lab, he says sometimes feels like he is suffocating. I advised that Dr Vickey Huger was recommending that we bring him in for a BiPAP titration which will allow [place] to adjust the machine to the setting that makes him most comfortable. Advised that may mean moving to the next machine up. I advise this will also allow the oxygen levels to be addressed and oxygen to be added if needed. Pt verbalized understanding. Pt had no questions at this time but was encouraged to call back if questions arise. He will wait for the call to be scheduled.

## 2020-04-05 ENCOUNTER — Ambulatory Visit: Payer: Self-pay | Admitting: Adult Health

## 2020-04-13 ENCOUNTER — Ambulatory Visit: Payer: Self-pay | Admitting: Adult Health

## 2020-04-15 ENCOUNTER — Other Ambulatory Visit (HOSPITAL_COMMUNITY)
Admission: RE | Admit: 2020-04-15 | Discharge: 2020-04-15 | Disposition: A | Discharge status: Home / Self Care | Payer: No Typology Code available for payment source | Source: Ambulatory Visit | Attending: Neurology | Admitting: Neurology

## 2020-04-15 DIAGNOSIS — Z20822 Contact with and (suspected) exposure to covid-19: Secondary | ICD-10-CM | POA: Insufficient documentation

## 2020-04-15 DIAGNOSIS — Z01812 Encounter for preprocedural laboratory examination: Secondary | ICD-10-CM | POA: Insufficient documentation

## 2020-04-16 LAB — SARS CORONAVIRUS 2 (TAT 6-24 HRS): SARS Coronavirus 2: NEGATIVE

## 2020-04-18 NOTE — Progress Notes (Signed)
Negative corona swab test.

## 2020-05-26 ENCOUNTER — Emergency Department (HOSPITAL_BASED_OUTPATIENT_CLINIC_OR_DEPARTMENT_OTHER): Payer: No Typology Code available for payment source

## 2020-05-26 ENCOUNTER — Other Ambulatory Visit: Payer: Self-pay

## 2020-05-26 ENCOUNTER — Emergency Department (HOSPITAL_BASED_OUTPATIENT_CLINIC_OR_DEPARTMENT_OTHER)
Admission: EM | Admit: 2020-05-26 | Discharge: 2020-05-26 | Disposition: A | Discharge status: Home / Self Care | Payer: No Typology Code available for payment source | Attending: Emergency Medicine | Admitting: Emergency Medicine

## 2020-05-26 ENCOUNTER — Encounter (HOSPITAL_BASED_OUTPATIENT_CLINIC_OR_DEPARTMENT_OTHER): Payer: Self-pay | Admitting: Emergency Medicine

## 2020-05-26 DIAGNOSIS — S99921A Unspecified injury of right foot, initial encounter: Secondary | ICD-10-CM

## 2020-05-26 DIAGNOSIS — W010XXA Fall on same level from slipping, tripping and stumbling without subsequent striking against object, initial encounter: Secondary | ICD-10-CM | POA: Diagnosis not present

## 2020-05-26 DIAGNOSIS — F17210 Nicotine dependence, cigarettes, uncomplicated: Secondary | ICD-10-CM | POA: Diagnosis not present

## 2020-05-26 DIAGNOSIS — Y92094 Garage of other non-institutional residence as the place of occurrence of the external cause: Secondary | ICD-10-CM | POA: Diagnosis not present

## 2020-05-26 DIAGNOSIS — Y93B3 Activity, free weights: Secondary | ICD-10-CM | POA: Diagnosis not present

## 2020-05-26 DIAGNOSIS — Y999 Unspecified external cause status: Secondary | ICD-10-CM | POA: Diagnosis not present

## 2020-05-26 DIAGNOSIS — S92001A Unspecified fracture of right calcaneus, initial encounter for closed fracture: Secondary | ICD-10-CM | POA: Diagnosis not present

## 2020-05-26 DIAGNOSIS — M79671 Pain in right foot: Secondary | ICD-10-CM | POA: Diagnosis present

## 2020-05-26 DIAGNOSIS — W19XXXA Unspecified fall, initial encounter: Secondary | ICD-10-CM

## 2020-05-26 HISTORY — DX: Sleep apnea, unspecified: G47.30

## 2020-05-26 MED ORDER — NAPROXEN 250 MG PO TABS
500.0000 mg | ORAL_TABLET | Freq: Once | ORAL | Status: AC
  Administered 2020-05-26: 500 mg via ORAL
  Filled 2020-05-26: qty 2

## 2020-05-26 MED ORDER — HYDROCODONE-ACETAMINOPHEN 5-325 MG PO TABS
1.0000 | ORAL_TABLET | Freq: Once | ORAL | Status: AC
  Administered 2020-05-26: 1 via ORAL
  Filled 2020-05-26: qty 1

## 2020-05-26 MED ORDER — HYDROCODONE-ACETAMINOPHEN 5-325 MG PO TABS: 1.0000 | ORAL_TABLET | Freq: Four times a day (QID) | ORAL | 0 refills | Status: DC | PRN

## 2020-05-26 MED FILL — HYDROCODON-APAP 5-325: 5-325 | 2 days supply | Qty: 6 | Fill #0

## 2020-05-26 NOTE — ED Triage Notes (Signed)
Pt states he tripped over something in the garage and fell  Pt is c/o right heel pain  Pt has hx of surgery to that ankle with plates and screws in place

## 2020-05-26 NOTE — ED Provider Notes (Signed)
[facility] Provider Note: Lowella Dell, MD, FACEP  CSN: [id number] MRN: [id number] ARRIVAL: 05/26/20 at 0406 ROOM: MH10/[facility]10   CHIEF COMPLAINT  Fall   HISTORY OF PRESENT ILLNESS  05/26/20 5:11 AM Tyler Briggs is a 44 y.o. male who is status post remote ORIF of the right ankle for bimalleolar fracture.  He tripped in his garage about 2 hours ago and fell.  Is complaining of right heel pain which he rates as a 10 out of 10 at its worst (weightbearing) and throbbing in nature.  At rest it is less severe.  There is no associated deformity.  He has not taken anything for it.   Past Medical History:  Diagnosis Date  . Sleep apnea     Past Surgical History:  Procedure Laterality Date  . ANKLE SURGERY      Family History  Problem Relation Age of Onset  . Sudden death Mother   . Heart attack Mother   . Heart failure Mother   . Hypertension Father   . Hyperlipidemia Neg Hx   . Diabetes Neg Hx     Social History   Tobacco Use  . Smoking status: Current Every Day Smoker    Packs/day: 1.00    Types: Cigarettes  . Smokeless tobacco: Never Used  Vaping Use  . Vaping Use: Former  Substance Use Topics  . Alcohol use: Yes    Comment: socially  . Drug use: No    Prior to Admission medications   Medication Sig Start Date End Date Taking? Authorizing Provider  HYDROcodone-acetaminophen (NORCO) 5-325 MG tablet Take 1 tablet by mouth every 6 (six) hours as needed for severe pain. 05/26/20   Zia Kanner, MD    Allergies Shrimp [shellfish allergy]   REVIEW OF SYSTEMS  Negative except as noted here or in the History of Present Illness.   PHYSICAL EXAMINATION  Initial Vital Signs Blood pressure (!) 167/99, pulse 97, temperature 98 F (36.7 C), temperature source Oral, resp. rate 20, height 6\' 2"  (1.88 m), weight (!) 167.8 kg, SpO2 97 %.  Examination General: Well-developed, well-nourished male in no acute distress; appearance consistent with age of  record HENT: normocephalic; atraumatic Eyes: pupils equal, round and reactive to light; extraocular muscles intact Neck: supple Heart: regular rate and rhythm Lungs: clear to auscultation bilaterally Abdomen: soft; nondistended; nontender; bowel sounds present Extremities: No deformity; full range of motion except right ankle; pulses normal; tenderness of right heel without ecchymosis or significant swelling Neurologic: Awake, alert and oriented; motor function intact in all extremities and symmetric; no facial droop Skin: Warm and dry Psychiatric: Normal mood and affect   RESULTS  Summary of this visit's results, reviewed and interpreted by myself:   EKG Interpretation  Date/Time:    Ventricular Rate:    PR Interval:    QRS Duration:   QT Interval:    QTC Calculation:   R Axis:     Text Interpretation:        Laboratory Studies: No results found for this or any previous visit (from the past 24 hour(s)). Imaging Studies: DG Foot Complete Right  Result Date: 05/26/2020 CLINICAL DATA:  Fall, medial foot pain EXAM: RIGHT FOOT COMPLETE - 3+ VIEW COMPARISON:  September 03, 2017 FINDINGS: There is no evidence of fracture or dislocation. The patient is status post ORIF with screw and plate fixation of the distal fibula and medial malleolus. Mild dorsal soft tissue swelling is seen. Small calcaneal enthesophytes are present. IMPRESSION: No  acute osseous abnormality. Electronically Signed   By: Prudencio Pair M.D.   On: 05/26/2020 05:00    ED COURSE and MDM  Nursing notes, initial and subsequent vitals signs, including pulse oximetry, reviewed and interpreted by myself.  Vitals:   05/26/20 0426 05/26/20 0431  BP:  (!) 167/99  Pulse:  97  Resp:  20  Temp:  98 F (36.7 C)  TempSrc:  Oral  SpO2:  97%  Weight: (!) 167.8 kg   Height: 6\' 2"  (1.88 m)    Medications  naproxen (NAPROSYN) tablet 500 mg (has no administration in time range)  HYDROcodone-acetaminophen (NORCO/VICODIN)  5-325 MG per tablet 1 tablet (has no administration in time range)    I have a low suspicion of a right calcaneus fracture and his plain films are negative.  We will treat with an NSAID and keep him nonweightbearing and should pain persist or worsen we would consider a CT scan to evaluate for an occult calcaneus fracture.  PROCEDURES  Procedures   ED DIAGNOSES     ICD-10-CM   1. Fall  W19.XXXA DG Foot Complete Right    DG Foot Complete Right  2. Injury of heel, right, initial encounter  A21.308M        Shanon Rosser, MD 05/26/20 [id number]

## 2020-05-26 NOTE — ED Notes (Signed)
ED Provider at bedside. 

## 2020-09-12 ENCOUNTER — Encounter: Payer: Self-pay | Admitting: Neurology

## 2020-09-12 NOTE — Telephone Encounter (Signed)
Pt no showed to his Bipap study on 04/18/20. When they no show it is the pt's responsibility to call us back to r/s if they wish. Pt never called [place] back.

## 2020-10-02 ENCOUNTER — Ambulatory Visit (INDEPENDENT_AMBULATORY_CARE_PROVIDER_SITE_OTHER): Payer: No Typology Code available for payment source | Admitting: Neurology

## 2020-10-02 DIAGNOSIS — G4710 Hypersomnia, unspecified: Secondary | ICD-10-CM

## 2020-10-02 DIAGNOSIS — G4730 Sleep apnea, unspecified: Secondary | ICD-10-CM

## 2020-10-02 DIAGNOSIS — J40 Bronchitis, not specified as acute or chronic: Secondary | ICD-10-CM

## 2020-10-02 DIAGNOSIS — G4733 Obstructive sleep apnea (adult) (pediatric): Secondary | ICD-10-CM

## 2020-10-02 DIAGNOSIS — G4734 Idiopathic sleep related nonobstructive alveolar hypoventilation: Secondary | ICD-10-CM

## 2020-10-02 DIAGNOSIS — G4719 Other hypersomnia: Secondary | ICD-10-CM

## 2020-10-16 NOTE — Progress Notes (Signed)
Excessively sleepy and only partially apnea treated patient with high residual AHI and hypoxemia on auto BiPAP at home. DIAGNOSIS even the highest possible setting of BiPAP was not  successful in treating this Complex Apnea successfully. ASV was  titrated at minimum pressure remained at 3 cm water throughout  the titration, the maximum pressure support was 15 cm water  throughout the night and the EEP varied. ASV failed.   1. Complex and primarily Obstructive Sleep Apnea persisted  through the titration- BiPAP did best at 21/16 cm water with an  AHI of 8.6/h. where the patient had no associated PLM activity and was able to reach  REM sleep.  2. The severe hypoxemia persisted through the titration.  3. VERY Early REM sleep and high sleep efficiency were remarkable  findings given the above noted factors. Check for narcolepsy.    PLANS/RECOMMENDATIONS: Will continue on BiPAP, reset to 21/15 ( 6 cm spread) . Supine sleep  should be avoided.   DISCUSSION: For comfort and best possible result (not ideal)  this patient will remain treated with BiPAP at 21/15 cm water and  a mask of his choice.

## 2020-10-16 NOTE — Addendum Note (Signed)
Addended by: Melvyn Novas on: 10/16/2020 05:14 PM   Modules accepted: Orders

## 2020-10-16 NOTE — Procedures (Signed)
PATIENT'S NAME:  Tyler Briggs, Tyler Briggs DOB:      07-18-1976      MR#:    [id number]     DATE OF RECORDING: 10/02/2020 [facility] REFERRING M.D.:  [name], MD Study Performed:   BiPAP and ASV Titration HISTORY:  Tyler Briggs returns for a BIPAP/ASV Titration. The patient had used auto BiPAP with a 5 cm pressure spread. He returns due to low oxygen saturation and residual AHI under BIPAP at home. He was seen 11.18.2020 in Consultation, 12.20.2020 he underwent a PSG, diagnosing him with severe apnea. The titration study from 01-15-2020 had shown no favorable response to CPAP and PSG tech switched to BiPAP at 20/15 cm water, using a ResMed F 20 in large size - after the test, he was set up with autotitration BiPAP. Download of his BiPAP showed still incomplete response and ONO confirmed hypoxemia.   The patient endorsed the Epworth Sleepiness Scale at 18/24 points and the Fatigue Score at -40/63- points.   The patient's weight 371 pounds with a height of 72 (inches), resulting in a BMI of 50.2 kg/m2. The patient's neck circumference measured 19 inches.  CURRENT MEDICATIONS: NONE    PROCEDURE:  This is a multichannel digital polysomnogram utilizing the SomnoStar 11.2 system.  Electrodes and sensors were applied and monitored per AASM Specifications.   EEG, EOG, Chin and Limb EMG, were sampled at 200 Hz.  ECG, Snore and Nasal Pressure, Thermal Airflow, Respiratory Effort, CPAP Flow and Pressure, Oximetry was sampled at 50 Hz. Digital video and audio were recorded.       The patient was again given a ResMed F20 large full- face mask. BiPAP was initiated at 20/15 cmH20 with heated humidity per AASM split night standards and pressure was advanced to 25/21cmH20 because of hypopneas, apneas and AHI of 15.3/h.  He switched to ASV pressure of 15/3/05 cm and titrated to 15/3/10 cm without success as apneas majority OSA, not CSA.  Lights Out was at 20:50 and Lights On at 05:12. Total recording time (TRT) was 502.5  minutes, with a total sleep time (TST) of 483 minutes. The patient's sleep latency was 10.5 minutes. REM latency was 7.5 minutes.  The sleep efficiency was 96.1 %.    SLEEP ARCHITECTURE: WASO (Wake after sleep onset) was 16 minutes.  There were 8 minutes in Stage N1, 341 minutes Stage N2, 19 minutes Stage N3 and 115 minutes in Stage REM.  The percentage of Stage N1 was 1.7%, Stage N2 was 70.6%, Stage N3 was 3.9% and Stage R (REM sleep) was 23.8%. The sleep architecture was notable for extremely short REM latency and high sleep efficiency   RESPIRATORY ANALYSIS:  There was a total of 423 respiratory events: 61 obstructive apneas, 33 central apneas and 0 mixed apneas with a total of 94 apneas and an apnea index (AI) of 11.7 /hour. There were 329 hypopneas with a hypopnea index of 40.9/hour.   The total APNEA/HYPOPNEA INDEX  (AHI) was 52.5 /hour and the total RESPIRATORY DISTURBANCE INDEX was 52.5 /hour  82 events occurred in REM sleep and 341 events in NREM. The REM AHI was 42.8 /hour versus a non-REM AHI of 55.6 /hour.  The patient spent 427 minutes of total sleep time in the supine position and 56 minutes in non-supine. The supine AHI was 48.6, versus a non-supine AHI of 82.5.  OXYGEN SATURATION & C02:  The baseline 02 saturation was 92%, with the lowest being 71%. Time spent below 89% saturation equaled  301 minutes.  The arousals were noted as: 48 were spontaneous, 0 were associated with PLMs, 145 were associated with respiratory events. The patient had a total of 12 Periodic Limb Movements.   Audio and video analysis did not show any abnormal or unusual movements, behaviors, phonations or vocalizations.    EKG was in normal sinus rhythm.  DIAGNOSIS even the highest possible setting of BiPAP was not successful in treating this Complex Apnea successfully. ASV was titrated at minimum pressure remained at 3 cm water throughout the titration, the maximum pressure support was 15 cm water throughout the  night and the EEP varied. ASV failed.   1. Complex and primarily Obstructive Sleep Apnea persisted through the titration- BiPAP did best at 21/16 cm water with an AHI of 8.6/h. and had no associated PLMs and was able to reach REM sleep.  2. The severe hypoxemia persisted through the titration.  3. Early REM sleep and high sleep efficiency were remarkable findings given the above noted factors.    PLANS/RECOMMENDATIONS: Will continue on BiPAP. Supine sleep should be avoided.    DISCUSSION: For comfort and best possible result (not ideal) this patient will remain treated with BiPAP at 21/15 cm water and a mask of his choice.   A follow up appointment will be scheduled in the Sleep Clinic at [facility].   Please call [phone] with any questions.      I certify that I have reviewed the entire raw data recording prior to the issuance of this report in accordance with the Standards of Accreditation of the American Academy of Sleep Medicine (AASM)  Melvyn Novas, M.D. Diplomat, Biomedical engineer of Psychiatry and Neurology  Diplomat, Biomedical engineer of Sleep Medicine Wellsite geologist, [employer] [facility] [employer]

## 2020-10-17 ENCOUNTER — Encounter: Payer: Self-pay | Admitting: Neurology

## 2020-10-17 ENCOUNTER — Telehealth: Payer: Self-pay | Admitting: Neurology

## 2020-10-17 NOTE — Telephone Encounter (Signed)
-----   Message from Melvyn Novas, MD sent at 10/16/2020  5:14 PM EST ----- Excessively sleepy and only partially apnea treated patient with high residual AHI and hypoxemia on auto BiPAP at home. DIAGNOSIS even the highest possible setting of BiPAP was not  successful in treating this Complex Apnea successfully. ASV was  titrated at minimum pressure remained at 3 cm water throughout  the titration, the maximum pressure support was 15 cm water  throughout the night and the EEP varied. ASV failed.   1. Complex and primarily Obstructive Sleep Apnea persisted  through the titration- BiPAP did best at 21/16 cm water with an  AHI of 8.6/h. where the patient had no associated PLM activity and was able to reach  REM sleep.  2. The severe hypoxemia persisted through the titration.  3. VERY Early REM sleep and high sleep efficiency were remarkable  findings given the above noted factors. Check for narcolepsy.    PLANS/RECOMMENDATIONS: Will continue on BiPAP, reset to 21/15 ( 6 cm spread) . Supine sleep  should be avoided.   DISCUSSION: For comfort and best possible result (not ideal)  this patient will remain treated with BiPAP at 21/15 cm water and  a mask of his choice.

## 2020-10-17 NOTE — Telephone Encounter (Signed)
Called patient to discuss sleep study results. No answer at this time. LVM for the patient to call back.  Will send a mychart message as well. 

## 2020-10-26 NOTE — Telephone Encounter (Signed)
Called the patient and discussed the results somewhat with him. He stated his wife had already addressed this with him. I advised that the pressure change was already set on the machine 10/19/20. Informed him he would need to start using it each night > 4hrs. Pt verbalized understanding. I scheduled a follow up visit for him. 01/10/2020 at 1:30 pm. Pt advised if he has difficulties with using the machine to make sure he reaches out to the company so they can help assist with changes. Her verbalized understanding.

## 2021-01-09 ENCOUNTER — Ambulatory Visit: Payer: Self-pay | Admitting: Neurology

## 2021-01-31 ENCOUNTER — Encounter: Payer: Self-pay | Admitting: Neurology

## 2021-01-31 ENCOUNTER — Ambulatory Visit: Payer: Self-pay | Admitting: Neurology

## 2021-01-31 ENCOUNTER — Other Ambulatory Visit: Payer: Self-pay

## 2021-01-31 VITALS — BP 142/91 | HR 86 | Ht 74.0 in | Wt 396.0 lb

## 2021-01-31 DIAGNOSIS — G4733 Obstructive sleep apnea (adult) (pediatric): Secondary | ICD-10-CM | POA: Diagnosis not present

## 2021-01-31 DIAGNOSIS — G4730 Sleep apnea, unspecified: Secondary | ICD-10-CM | POA: Diagnosis not present

## 2021-01-31 DIAGNOSIS — Z6841 Body Mass Index (BMI) 40.0 and over, adult: Secondary | ICD-10-CM

## 2021-01-31 DIAGNOSIS — G4734 Idiopathic sleep related nonobstructive alveolar hypoventilation: Secondary | ICD-10-CM

## 2021-01-31 DIAGNOSIS — G4710 Hypersomnia, unspecified: Secondary | ICD-10-CM | POA: Diagnosis not present

## 2021-01-31 DIAGNOSIS — J40 Bronchitis, not specified as acute or chronic: Secondary | ICD-10-CM | POA: Diagnosis not present

## 2021-01-31 NOTE — Patient Instructions (Signed)
Steps to Quit Smoking Smoking tobacco is the leading cause of preventable death. It can affect almost every organ in the body. Smoking puts you and people around you at risk for many serious, long-lasting (chronic) diseases. Quitting smoking can be hard, but it is one of the best things that you can do for your health. It is never too late to quit. How do I get ready to quit? When you decide to quit smoking, make a plan to help you succeed. Before you quit:  Pick a date to quit. Set a date within the next 2 weeks to give you time to prepare.  Write down the reasons why you are quitting. Keep this list in places where you will see it often.  Tell your family, friends, and co-workers that you are quitting. Their support is important.  Talk with your doctor about the choices that may help you quit.  Find out if your health insurance will pay for these treatments.  Know the people, places, things, and activities that make you want to smoke (triggers). Avoid them. What first steps can I take to quit smoking?  Throw away all cigarettes at home, at work, and in your car.  Throw away the things that you use when you smoke, such as ashtrays and lighters.  Clean your car. Make sure to empty the ashtray.  Clean your home, including curtains and carpets. What can I do to help me quit smoking? Talk with your doctor about taking medicines and seeing a counselor at the same time. You are more likely to succeed when you do both.  If you are pregnant or breastfeeding, talk with your doctor about counseling or other ways to quit smoking. Do not take medicine to help you quit smoking unless your doctor tells you to do so. To quit smoking: Quit right away  Quit smoking totally, instead of slowly cutting back on how much you smoke over a period of time.  Go to counseling. You are more likely to quit if you go to counseling sessions regularly. Take medicine You may take medicines to help you quit. Some  medicines need a prescription, and some you can buy over-the-counter. Some medicines may contain a drug called nicotine to replace the nicotine in cigarettes. Medicines may:  Help you to stop having the desire to smoke (cravings).  Help to stop the problems that come when you stop smoking (withdrawal symptoms). Your doctor may ask you to use:  Nicotine patches, gum, or lozenges.  Nicotine inhalers or sprays.  Non-nicotine medicine that is taken by mouth. Find resources Find resources and other ways to help you quit smoking and remain smoke-free after you quit. These resources are most helpful when you use them often. They include:  Online chats with a counselor.  Phone quitlines.  Printed self-help materials.  Support groups or group counseling.  Text messaging programs.  Mobile phone apps. Use apps on your mobile phone or tablet that can help you stick to your quit plan. There are many free apps for mobile phones and tablets as well as websites. Examples include Quit Guide from the CDC and smokefree.gov   What things can I do to make it easier to quit?  Talk to your family and friends. Ask them to support and encourage you.  Call a phone quitline (1-800-QUIT-NOW), reach out to support groups, or work with a counselor.  Ask people who smoke to not smoke around you.  Avoid places that make you want to smoke,   such as: ? Bars. ? Parties. ? Smoke-break areas at work.  Spend time with people who do not smoke.  Lower the stress in your life. Stress can make you want to smoke. Try these things to help your stress: ? Getting regular exercise. ? Doing deep-breathing exercises. ? Doing yoga. ? Meditating. ? Doing a body scan. To do this, close your eyes, focus on one area of your body at a time from head to toe. Notice which parts of your body are tense. Try to relax the muscles in those areas.   How will I feel when I quit smoking? Day 1 to 3 weeks Within the first 24 hours,  you may start to have some problems that come from quitting tobacco. These problems are very bad 2-3 days after you quit, but they do not often last for more than 2-3 weeks. You may get these symptoms:  Mood swings.  Feeling restless, nervous, angry, or annoyed.  Trouble concentrating.  Dizziness.  Strong desire for high-sugar foods and nicotine.  Weight gain.  Trouble pooping (constipation).  Feeling like you may vomit (nausea).  Coughing or a sore throat.  Changes in how the medicines that you take for other issues work in your body.  Depression.  Trouble sleeping (insomnia). Week 3 and afterward After the first 2-3 weeks of quitting, you may start to notice more positive results, such as:  Better sense of smell and taste.  Less coughing and sore throat.  Slower heart rate.  Lower blood pressure.  Clearer skin.  Better breathing.  Fewer sick days. Quitting smoking can be hard. Do not give up if you fail the first time. Some people need to try a few times before they succeed. Do your best to stick to your quit plan, and talk with your doctor if you have any questions or concerns. Summary  Smoking tobacco is the leading cause of preventable death. Quitting smoking can be hard, but it is one of the best things that you can do for your health.  When you decide to quit smoking, make a plan to help you succeed.  Quit smoking right away, not slowly over a period of time.  When you start quitting, seek help from your doctor, family, or friends. This information is not intended to replace advice given to you by your health care provider. Make sure you discuss any questions you have with your health care provider. Document Revised: 08/21/2019 Document Reviewed: 02/14/2019 Elsevier Patient Education  2021 [employer] Obesity, Adult Obesity is having too much body fat. Being obese means that your weight is more than what is healthy for you. BMI is a number that  explains how much body fat you have. If you have a BMI of 30 or more, you are obese. Obesity is often caused by eating or drinking more calories than your body uses. Changing your lifestyle can help you lose weight. Obesity can cause serious health problems, such as:  Stroke.  Coronary artery disease (CAD).  Type 2 diabetes.  Some types of cancer, including cancers of the colon, breast, uterus, and gallbladder.  Osteoarthritis.  High blood pressure (hypertension).  High cholesterol.  Sleep apnea.  Gallbladder stones.  Infertility problems. What are the causes?  Eating meals each day that are high in calories, sugar, and fat.  Being born with genes that may make you more likely to become obese.  Having a medical condition that causes obesity.  Taking certain medicines.  Sitting a lot (having  a sedentary lifestyle).  Not getting enough sleep.  Drinking a lot of drinks that have sugar in them. What increases the risk?  Having a family history of obesity.  Being an [place] woman.  Being a Hispanic man.  Living in an area with limited access to: ? [place], recreation centers, or sidewalks. ? Healthy food choices, such as grocery stores and farmers' markets. What are the signs or symptoms? The main sign is having too much body fat. How is this treated?  Treatment for this condition often includes changing your lifestyle. Treatment may include: ? Changing your diet. This may include making a healthy meal plan. ? Exercise. This may include activity that causes your heart to beat faster (aerobic exercise) and strength training. Work with your doctor to design a program that works for you. ? Medicine to help you lose weight. This may be used if you are not able to lose 1 pound a week after 6 weeks of healthy eating and more exercise. ? Treating conditions that cause the obesity. ? Surgery. Options may include gastric banding and gastric bypass. This may be done  if:  Other treatments have not helped to improve your condition.  You have a BMI of 40 or higher.  You have life-threatening health problems related to obesity. Follow these instructions at home: Eating and drinking  Follow advice from your doctor about what to eat and drink. Your doctor may tell you to: ? Limit fast food, sweets, and processed snack foods. ? Choose low-fat options. For example, choose low-fat milk instead of whole milk. ? Eat 5 or more servings of fruits or vegetables each day. ? Eat at home more often. This gives you more control over what you eat. ? Choose healthy foods when you eat out. ? Learn to read food labels. This will help you learn how much food is in 1 serving. ? Keep low-fat snacks available. ? Avoid drinks that have a lot of sugar in them. These include soda, fruit juice, iced tea with sugar, and flavored milk.  Drink enough water to keep your pee (urine) pale yellow.  Do not go on fad diets.   Physical activity  Exercise often, as told by your doctor. Most adults should get up to 150 minutes of moderate-intensity exercise every week.Ask your doctor: ? What types of exercise are safe for you. ? How often you should exercise.  Warm up and stretch before being active.  Do slow stretching after being active (cool down).  Rest between times of being active. Lifestyle  Work with your doctor and a food expert (dietitian) to set a weight-loss goal that is best for you.  Limit your screen time.  Find ways to reward yourself that do not involve food.  Do not drink alcohol if: ? Your doctor tells you not to drink. ? You are pregnant, may be pregnant, or are planning to become pregnant.  If you drink alcohol: ? Limit how much you use to:  0-1 drink a day for women.  0-2 drinks a day for men. ? Be aware of how much alcohol is in your drink. In the [place] one drink equals one 12 oz bottle of beer (355 mL), one 5 oz glass of wine (148 mL), or one  1 oz glass of hard liquor (44 mL). General instructions  Keep a weight-loss journal. This can help you keep track of: ? The food that you eat. ? How much exercise you get.  Take over-the-counter and  prescription medicines only as told by your doctor.  Take vitamins and supplements only as told by your doctor.  Think about joining a support group.  Keep all follow-up visits as told by your doctor. This is important. Contact a doctor if:  You cannot meet your weight loss goal after you have changed your diet and lifestyle for 6 weeks. Get help right away if you:  Are having trouble breathing.  Are having thoughts of harming yourself. Summary  Obesity is having too much body fat.  Being obese means that your weight is more than what is healthy for you.  Work with your doctor to set a weight-loss goal.  Get regular exercise as told by your doctor. This information is not intended to replace advice given to you by your health care provider. Make sure you discuss any questions you have with your health care provider. Document Revised: 07/31/2018 Document Reviewed: 07/31/2018 Elsevier Patient Education  2021 [employer] Inc. Quality Sleep Information, Adult Quality sleep is important for your mental and physical health. It also improves your quality of life. Quality sleep means you:  Are asleep for most of the time you are in bed.  Fall asleep within 30 minutes.  Wake up no more than once a night.  Are awake for no longer than 20 minutes if you do wake up during the night. Most adults need 7-8 hours of quality sleep each night. How can poor sleep affect me? If you do not get enough quality sleep, you may have:  Mood swings.  Daytime sleepiness.  Confusion.  Decreased reaction time.  Sleep disorders, such as insomnia and sleep apnea.  Difficulty with: ? Solving problems. ? Coping with stress. ? Paying attention. These issues may affect your performance and  productivity at work, school, and at home. Lack of sleep may also put you at higher risk for accidents, suicide, and risky behaviors. If you do not get quality sleep you may also be at higher risk for several health problems, including:  Infections.  Type 2 diabetes.  Heart disease.  High blood pressure.  Obesity.  Worsening of long-term conditions, like arthritis, kidney disease, depression, Parkinson's disease, and epilepsy. What actions can I take to get more quality sleep?  Stick to a sleep schedule. Go to sleep and wake up at about the same time each day. Do not try to sleep less on weekdays and make up for lost sleep on weekends. This does not work.  Try to get about 30 minutes of exercise on most days. Do not exercise 2-3 hours before going to bed.  Limit naps during the day to 30 minutes or less.  Do not use any products that contain nicotine or tobacco, such as cigarettes or e-cigarettes. If you need help quitting, ask your health care provider.  Do not drink caffeinated beverages for at least 8 hours before going to bed. Coffee, tea, and some sodas contain caffeine.  Do not drink alcohol close to bedtime.  Do not eat large meals close to bedtime.  Do not take naps in the late afternoon.  Try to get at least 30 minutes of sunlight every day. Morning sunlight is best.  Make time to relax before bed. Reading, listening to music, or taking a hot bath promotes quality sleep.  Make your bedroom a place that promotes quality sleep. Keep your bedroom dark, quiet, and at a comfortable room temperature. Make sure your bed is comfortable. Take out sleep distractions like TV, a computer, smartphone,  and bright lights.  If you are lying awake in bed for longer than 20 minutes, get up and do a relaxing activity until you feel sleepy.  Work with your health care provider to treat medical conditions that may affect sleeping, such as: ? Nasal obstruction. ? Snoring. ? Sleep apnea  and other sleep disorders.  Talk to your health care provider if you think any of your prescription medicines may cause you to have difficulty falling or staying asleep.  If you have sleep problems, talk with a sleep consultant. If you think you have a sleep disorder, talk with your health care provider about getting evaluated by a specialist.      Where to find more information  National Sleep Foundation website: https://sleepfoundation.org  National Heart, Lung, and Blood Institute (NHLBI): https://hall.info/.pdf  Centers for Disease Control and Prevention (CDC): DetailSports.is Contact a health care provider if you:  Have trouble getting to sleep or staying asleep.  Often wake up very early in the morning and cannot get back to sleep.  Have daytime sleepiness.  Have daytime sleep attacks of suddenly falling asleep and sudden muscle weakness (narcolepsy).  Have a tingling sensation in your legs with a strong urge to move your legs (restless legs syndrome).  Stop breathing briefly during sleep (sleep apnea).  Think you have a sleep disorder or are taking a medicine that is affecting your quality of sleep. Summary  Most adults need 7-8 hours of quality sleep each night.  Getting enough quality sleep is an important part of health and well-being.  Make your bedroom a place that promotes quality sleep and avoid things that may cause you to have poor sleep, such as alcohol, caffeine, smoking, and large meals.  Talk to your health care provider if you have trouble falling asleep or staying asleep. This information is not intended to replace advice given to you by your health care provider. Make sure you discuss any questions you have with your health care provider. Document Revised: 03/05/2018 Document Reviewed: 03/05/2018 Elsevier Patient Education  2021 [employer].

## 2021-01-31 NOTE — Progress Notes (Signed)
SLEEP MEDICINE CLINIC    Provider:  Melvyn Novas, MD  Primary Care Physician:  [name], MD [address] [place] [address] [place] [address]     Referring Provider: [name], Md [address] [place],  [place] [address]          Chief Complaint according to patient   Patient presents with:    . New Patient (Initial Visit)     1) This patient all the risk factors for OSA, including HTN,Obesity to BMI 50 kg/m2 and high grade airway obstruction, nasal patency restriction.  He also is a smoker, has been hoarse and coughing. OSA additionally very likely due to paternal history of OSA. Wife has observed snoring and apnea. OSA is the likely cause of : Excessive daytime sleepiness.  Nocturia.        HISTORY OF PRESENT ILLNESS:  Tyler Briggs is a 45 year old caucasian and Native American  male patient seen on 01/31/2021 : I have the pleasure of meeting with Tyler Briggs today in the presence of his wife following multiple sleep studies.  The patient had the first sleep study with [place] at the baseline polysomnogram performed on 12-20 2020.  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  There was not much of a positional component or REM versus non-REM component component 633 times he woke up due to these respiratory events. The patient was not split also had ordered a split study he had to return on 5 February at that time we could not completely titrate him he started with a ResMed F 20 fullface mask and large size and reached no conclusion on the CPAP AutoSet titration had already failed and he was now treated 2 x 2 to BiPAP at 15/20 cmH2O.  A repeat BiPAP full night titration revealed the best pressure at 21/16 cmH2O with a residual AHI of 8.6 no associated PLM activity he was able to reach REM sleep but severe hypoxemia persisted.  So there was a very early REM  sleep noted which also was an abnormal finding.   He is now using the above named BiPAP modality: actually he uses 21/15 cmH2O with easy breeze function, the  residual AHI is 5.9 today compliance by 80% hourly compliance is by 17% only average use at time is 3 hours 12 minutes.   There is no significant air leakage, the 95th percentile leak is 7.4 L which is mild.   His Epworth sleepiness score has decreased to 11 points from previously 18 his fatigue severity is much decreased as well, nocturia times one form 5 times !!!  So our goal today is to increase his daily use of CPAP to 4 hours or beyond.  Tyler Briggs takes the mask off after about 3 hours of sleep, he reports a" breathing issue"- he can't describe it. Phlegm or other wheezing is noted. He feels he cant breath in , no  difficulties to breath out.       Chief concern according to patient :  10-28-2019 I have the pleasure of seeing Tyler Briggs today, a right-handed  male patient with a possible sleep disorder. He has medical conditions of chronic tobacco use, morbid obesity, hypertension, He just has seen Dr.Kim as a new patient and former truck driver , who mentioned his excessive daytime sleepiness, reflected  in an Epworth score of 18/ 24. 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 His wife reports sleep- position indepenend thunderous snoring and sleep apneas. He can sleep better reclined. He has nocturia and a dry mouth. He is hoarse and coughing today.    The patient never had a sleep study.  Sleep relevant medical history: Nocturia -4-5 , Night terrors in childhood, daughter is a sleep walker- age 755-6   Family medical /sleep history: father had a CPAP with OSA,died at age 44 of a suicide. Sleep walkers: daughter is a sleep walker- age 23-6.   Social history: Patient is working as Archivistcabinet maker and lives in a household with 6 persons. Family status is married  with 4 children, the  patient grow up in a family with 10 siblings, 3 of them half sibling. The patient currently works in daytime, but works often late.  Pets are present, one dog- not sleeping in the bedroom.  Tobacco use- for 30 years  1 ppd.   ETOH use: seldomly, Caffeine intake in form of Coffee( 3-4/ week) Soda( mountain [place]ew . Daily 3-4 ) Tea ( seldomly) , but  monster energy drinks. Regular exercise: none .   Hobbies :none .     Sleep habits are as follows: The patient's dinner time is between 6 PM. The patient goes to bed at 9-11 PM and continues to sleep for 2 hours, wakes for the first of many bathroom breaks, the first time at 1 AM.   The preferred sleep position is right sided, with the support of 2 pillows.  Dreams are reportedly rare. 5.15  AM is the usual rise time. The patient wakes up with an alarm.  He reports not feeling refreshed or restored in AM, with symptoms such as dry mouth , no morning headaches, and residual fatigue.  Naps are taken frequently,  Dozing frequently, lasting from 10 to 25 minutes and are more refreshing than nocturnal sleep.    Review of Systems: Out of a complete 14 system review, the patient complains of only the following symptoms, and all other reviewed systems are negative.:  Fatigue, sleepiness , snoring, fragmented sleep,Nocturia.    How likely are you to doze in the following situations: 0 = not likely, 1 = slight chance, 2 = moderate chance, 3 = high chance   Sitting and Reading? Watching Television? Sitting inactive in a public place (theater or meeting)? As a passenger in a car for an hour without a break? Lying down in the afternoon when circumstances permit? Sitting and talking to someone? Sitting quietly after lunch without alcohol? In a car, while stopped for a few minutes in traffic?   Total = 11 on BIPAP. From previously 18/ 24 points   FSS endorsed at  28 from 40/ 63 points.   Shift work sleep restriction.    Social History   Socioeconomic  History  . Marital status: Single    Spouse name: Not on file  . Number of children: Not on file  . Years of education: Not on file  . Highest education level: Not on file  Occupational History  . Not on file  Tobacco Use  . Smoking status: Current Every Day Smoker    Packs/day: 1.00    Types: Cigarettes  . Smokeless tobacco: Never Used  Vaping Use  . Vaping Use: Former  Substance and Sexual Activity  . Alcohol use: Yes    Comment:  socially  . Drug use: No  . Sexual activity: Not on file  Other Topics Concern  . Not on file  Social History Narrative  . Not on file   Social Determinants of Health   Financial Resource Strain: Not on file  Food Insecurity: Not on file  Transportation Needs: Not on file  Physical Activity: Not on file  Stress: Not on file  Social Connections: Not on file    Family History  Problem Relation Age of Onset  . Sudden death Mother   . Heart attack Mother   . Heart failure Mother   . Hypertension Father   . Hyperlipidemia Neg Hx   . Diabetes Neg Hx     Past Medical History:  Diagnosis Date  . Sleep apnea     Past Surgical History:  Procedure Laterality Date  . ANKLE SURGERY       Physical exam:  Today's Vitals   01/31/21 1053  BP: (!) 142/91  Pulse: 86  Weight: (!) 396 lb (179.6 kg)  Height: 6\' 2"  (1.88 m)   Body mass index is 50.84 kg/m.   Wt Readings from Last 3 Encounters:  01/31/21 (!) 396 lb (179.6 kg)  05/26/20 (!) 370 lb (167.8 kg)  10/28/19 (!) 371 lb (168.3 kg)     Ht Readings from Last 3 Encounters:  01/31/21 6\' 2"  (1.88 m)  05/26/20 6\' 2"  (1.88 m)  10/28/19 6' (1.829 m)      General: The patient is awake, alert and appears not in acute distress. The patient is well groomed. Head: Normocephalic, atraumatic. Neck is supple. Mallampati 4,  neck circumference:19 inches . Nasal airflow restricted.  Retrognathia is not seen.  Patient is able to hold his breath for 45 seconds plus.  Dental status:   Cardiovascular:  Regular rate and cardiac rhythm by pulse,  without distended neck veins. Respiratory: Lungs are clear to auscultation.  Skin:  Without evidence of ankle edema, or rash. Trunk: The patient's posture is erect.   Neurologic exam : The patient is awake and alert, oriented to place and time.   Memory subjective described as intact.  Attention span & concentration ability appears normal.  Speech is fluent,  without  dysarthria, dysphonia or aphasia.  Mood and affect are appropriate.   Cranial nerves: no loss of smell or taste reported  Pupils are equal and briskly reactive to light. Funduscopic exam deferred.   Extraocular movements in vertical and horizontal planes were intact and without nystagmus. No Diplopia.  Facial motor strength is assymmetric but tongue is midline.  Left face formerly Bells palsy- non central facial droop and smaller eye opening.  Neck ROM : rotation, tilt and flexion extension were normal for age and shoulder shrug was symmetrical.    Motor exam:  Symmetric bulk, tone and ROM.   Normal tone without cog wheeling, symmetric grip strength . He is remarkably limber.   Gait and station: Patient could rise unassisted from a seated position, walked without assistive device.  Deep tendon reflexes: in the  upper and lower extremities are symmetric and intact.       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  Overnight ONO while on BiPAP. For chronic hypoxemia with complex apnea treated with BIPAP, severest OSA.   Smoking cessation.     My Plan is to proceed with: I would like to thank [name], MD [address] [place] [address] [place],  [place] [address] for allowing me to meet with and to take care of this pleasant patient.   In short, Tyler Briggs is presenting with EDS, uses BIPAP and has some success in controlling daytime sleepiness. I plan to follow up either personally or through our NP within 12 month.   He is encouraged to quit smoking. Use machine for at least 4 hours , even when restricited to not much more sleep time in daytime.  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   CC: I will share my notes with PCP.  Electronically signed by: Melvyn Novas, MD 01/31/2021 11:29 AM  [facility] Board certified by The [employer] of Sleep Medicine and Diplomate of the [employer] of Sleep Medicine. Board certified In Neurology through the ABPN, Fellow of the [employer] of Neurology. Medical Director of [facility].

## 2021-03-13 DIAGNOSIS — Z125 Encounter for screening for malignant neoplasm of prostate: Secondary | ICD-10-CM | POA: Diagnosis not present

## 2021-03-13 DIAGNOSIS — R7309 Other abnormal glucose: Secondary | ICD-10-CM | POA: Diagnosis not present

## 2021-03-13 DIAGNOSIS — Z0000 Encounter for general adult medical examination without abnormal findings: Secondary | ICD-10-CM | POA: Diagnosis not present

## 2021-03-13 DIAGNOSIS — R7989 Other specified abnormal findings of blood chemistry: Secondary | ICD-10-CM | POA: Diagnosis not present

## 2021-03-20 DIAGNOSIS — R7303 Prediabetes: Secondary | ICD-10-CM | POA: Diagnosis not present

## 2021-03-20 DIAGNOSIS — Z13220 Encounter for screening for lipoid disorders: Secondary | ICD-10-CM | POA: Diagnosis not present

## 2021-03-20 DIAGNOSIS — Z0000 Encounter for general adult medical examination without abnormal findings: Secondary | ICD-10-CM | POA: Diagnosis not present

## 2021-03-20 DIAGNOSIS — Z125 Encounter for screening for malignant neoplasm of prostate: Secondary | ICD-10-CM | POA: Diagnosis not present

## 2021-03-20 DIAGNOSIS — Z2821 Immunization not carried out because of patient refusal: Secondary | ICD-10-CM | POA: Diagnosis not present

## 2021-03-20 DIAGNOSIS — G4733 Obstructive sleep apnea (adult) (pediatric): Secondary | ICD-10-CM | POA: Diagnosis not present

## 2021-05-30 ENCOUNTER — Encounter: Payer: Self-pay | Admitting: Family Medicine

## 2021-05-30 ENCOUNTER — Ambulatory Visit: Payer: 59 | Admitting: Family Medicine

## 2021-05-30 VITALS — BP 153/96 | HR 95 | Ht 74.0 in | Wt 394.0 lb

## 2021-05-30 DIAGNOSIS — G4734 Idiopathic sleep related nonobstructive alveolar hypoventilation: Secondary | ICD-10-CM

## 2021-05-30 DIAGNOSIS — G4733 Obstructive sleep apnea (adult) (pediatric): Secondary | ICD-10-CM | POA: Diagnosis not present

## 2021-05-30 NOTE — Patient Instructions (Addendum)
Please continue using your BiPAP regularly. While your insurance requires that you use BiPAP at least 4 hours each night on 70% of the nights, I recommend, that you not skip any nights and use it throughout the night if you can. Getting used to BiPAP and staying with the treatment long term does take time and patience and discipline. Untreated obstructive sleep apnea when it is moderate to severe can have an adverse impact on cardiovascular health and raise her risk for heart disease, arrhythmias, hypertension, congestive heart failure, stroke and diabetes. Untreated obstructive sleep apnea causes sleep disruption, nonrestorative sleep, and sleep deprivation. This can have an impact on your day to day functioning and cause daytime sleepiness and impairment of cognitive function, memory loss, mood disturbance, and problems focussing. Using BiPAP regularly can improve these symptoms.   We will order a mask refitting from [facility]. If you have not heard from them in 2-3 days, please call to schedule appt. I will also reorder pulse oximetry while on BiPAP. Please continue good work!!! Keep working on meeting 4 hour compliance.   Follow up in 6 months

## 2021-05-30 NOTE — Progress Notes (Deleted)
PATIENT: Tyler Briggs DOB: 1976/09/02  REASON FOR VISIT: follow up HISTORY FROM: patient  No chief complaint on file.    HISTORY OF PRESENT ILLNESS: 05/30/21 Tyler Briggs is a 45 y.o. male here today for follow up for OSA on BiPAP.        HISTORY: (copied from Dr Dohmeier's previous note)  Tyler Briggs is a 45 year old caucasian and Native American  male patient seen on 01/31/2021 : I have the pleasure of meeting with Tyler Briggs today in the presence of his wife following multiple sleep studies.  The patient had the first sleep study with [place] at the baseline polysomnogram performed on 12-20 2020.  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  There was not much of a positional component or REM versus non-REM component component 633 times he woke up due to these respiratory events. The patient was not split also had ordered a split study he had to return on 5 February at that time we could not completely titrate him he started with a ResMed F 20 fullface mask and large size and reached no conclusion on the CPAP AutoSet titration had already failed and he was now treated 2 x 2 to BiPAP at 15/20 cmH2O.  A repeat BiPAP full night titration revealed the best pressure at 21/16 cmH2O with a residual AHI of 8.6 no associated PLM activity he was able to reach REM sleep but severe hypoxemia persisted.  So there was a very early REM sleep noted which also was an abnormal finding.   He is now using the above named BiPAP modality: actually he uses 21/15 cmH2O with easy breeze function, the  residual AHI is 5.9 today compliance by 80% hourly compliance is by 17% only average use at time is 3 hours 12 minutes.  There is no significant air leakage, the 95th percentile leak is 7.4 L which is mild.   His Epworth sleepiness score has decreased to 11 points from previously 18 his  fatigue severity is much decreased as well, nocturia times one form 5 times !!!   So our goal today is to increase his daily use of CPAP to 4 hours or beyond.   Tyler Briggs takes the mask off after about 3 hours of sleep, he reports a" breathing issue"- he can't describe it. Phlegm or other wheezing is noted. He feels he cant breath in , no  difficulties to breath out.   REVIEW OF SYSTEMS: Out of a complete 14 system review of symptoms, the patient complains only of the following symptoms, and all other reviewed systems are negative.  ESS: FSS:   ALLERGIES: Allergies  Allergen Reactions   Shrimp [Shellfish Allergy]     HOME MEDICATIONS: No outpatient medications prior to visit.   No facility-administered medications prior to visit.    PAST MEDICAL HISTORY: Past Medical History:  Diagnosis Date   Sleep apnea     PAST SURGICAL HISTORY: Past Surgical History:  Procedure Laterality Date   ANKLE SURGERY      FAMILY HISTORY: Family History  Problem Relation Age of Onset   Sudden death Mother    Heart attack Mother    Heart failure Mother    Hypertension Father    Hyperlipidemia Neg Hx    Diabetes Neg Hx     SOCIAL HISTORY: Social  History   Socioeconomic History   Marital status: Single    Spouse name: Not on file   Number of children: Not on file   Years of education: Not on file   Highest education level: Not on file  Occupational History   Not on file  Tobacco Use   Smoking status: Every Day    Packs/day: 1.00    Pack years: 0.00    Types: Cigarettes   Smokeless tobacco: Never  Vaping Use   Vaping Use: Former  Substance and Sexual Activity   Alcohol use: Yes    Comment: socially   Drug use: No   Sexual activity: Not on file  Other Topics Concern   Not on file  Social History Narrative   Not on file   Social Determinants of Health   Financial Resource Strain: Not on file  Food Insecurity: Not on file  Transportation Needs: Not on file   Physical Activity: Not on file  Stress: Not on file  Social Connections: Not on file  Intimate Partner Violence: Not on file     PHYSICAL EXAM  There were no vitals filed for this visit. There is no height or weight on file to calculate BMI.  Generalized: Well developed, in no acute distress  Cardiology: normal rate and rhythm, no murmur noted Respiratory: clear to auscultation bilaterally  Neurological examination  Mentation: Alert oriented to time, place, history taking. Follows all commands speech and language fluent Cranial nerve II-XII: Pupils were equal round reactive to light. Extraocular movements were full, visual field were full  Motor: The motor testing reveals 5 over 5 strength of all 4 extremities. Good symmetric motor tone is noted throughout.  Gait and station: Gait is normal.    DIAGNOSTIC DATA (LABS, IMAGING, TESTING) - I reviewed patient records, labs, notes, testing and imaging myself where available.  No flowsheet data found.   Lab Results  Component Value Date   WBC 14.7 (H) 11/27/2010   HGB 16.6 11/27/2010   HCT 47.0 11/27/2010   MCV 89.5 11/27/2010   PLT 234 11/27/2010      Component Value Date/Time   NA 135 11/27/2010 2140   K 3.8 11/27/2010 2140   CL 102 11/27/2010 2140   CO2 24 11/27/2010 2140   GLUCOSE 98 11/27/2010 2140   BUN 17 11/27/2010 2140   CREATININE 1.24 11/27/2010 2140   CALCIUM 9.4 11/27/2010 2140   GFRNONAA >60 11/27/2010 2140   GFRAA  11/27/2010 2140    >60        The eGFR has been calculated using the MDRD equation. This calculation has not been validated in all clinical situations. eGFR's persistently <60 mL/min signify possible Chronic Kidney Disease.   No results found for: CHOL, HDL, LDLCALC, LDLDIRECT, TRIG, CHOLHDL No results found for: HGBA1C No results found for: VITAMINB12 No results found for: TSH   ASSESSMENT AND PLAN 45 y.o. year old male  has a past medical history of Sleep apnea. here with  ***  No diagnosis found.   Hilbert Odor is doing well on BiPAP therapy. Compliance report reveals ***. *** was encouraged to continue using BiPAP nightly and for greater than 4 hours each night. We will update supply orders as indicated. Risks of untreated sleep apnea review and education materials provided. Healthy lifestyle habits encouraged. *** will follow up in ***, sooner if needed. *** verbalizes understanding and agreement with this plan.    No orders of the defined types were placed in this  encounter.    No orders of the defined types were placed in this encounter.     Debbora Presto, FNP-C 05/30/2021, 9:15 AM [facility] [address], [place], [place] [address] [phone]

## 2021-05-30 NOTE — Progress Notes (Signed)
PATIENT: Tyler Briggs DOB: [birth year]  REASON FOR VISIT: follow up HISTORY FROM: patient  Chief Complaint  Patient presents with   Obstructive Sleep Apnea    RM 1, w/ wife Jody. Here for BiPAP f/u, pt states some nights he has trouble with the machine leaking. Pt has no concerns.      HISTORY OF PRESENT ILLNESS: 05/30/21 ALL: Tyler Briggs is a 45 y.o. male here today for follow up for OSA on BiPAP. He is resting better with BiPAP. He is more energized during the day and does not usually have to take a nap. He has not had any difficulty with machine or supplies. He is no longer waking up at night with breathing difficulty. He does note air leaking form the mask. Currently using Resmed F20 FFM large. He would like to try mask refitting. He admits that he does not always get 4 hours of sleep. 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       HISTORY: (copied from Dr Dohmeier's previous note)  Tyler Briggs is a 45 year old caucasian and Native American  male patient seen on 01/31/2021 : I have the pleasure of meeting with Tyler Briggs today in the presence of his wife following multiple sleep studies.  The patient had the first sleep study with [place] at the baseline polysomnogram performed on 12-20 2020.  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  There was not much of a positional component or REM versus non-REM component component 633 times he woke up due to these respiratory events. The patient was not split also had ordered a split study he had to return on 5 February at that time we could not completely titrate him he started with a ResMed F 20 fullface mask and large size and reached no conclusion on the CPAP AutoSet titration had already failed and he was now treated 2 x 2 to BiPAP at 15/20 cmH2O.  A repeat BiPAP full night titration revealed the best pressure  at 21/16 cmH2O with a residual AHI of 8.6 no associated PLM activity he was able to reach REM sleep but severe hypoxemia persisted.  So there was a very early REM sleep noted which also was an abnormal finding.   He is now using the above named BiPAP modality: actually he uses 21/15 cmH2O with easy breeze function, the  residual AHI is 5.9 today compliance by 80% hourly compliance is by 17% only average use at time is 3 hours 12 minutes.  There is no significant air leakage, the 95th percentile leak is 7.4 L which is mild.   His Epworth sleepiness score has decreased to 11 points from previously 18 his fatigue severity is much decreased as well, nocturia times one form 5 times !!!   So our goal today is to increase his daily use of CPAP to 4 hours or beyond.   [name] takes the mask off after about 3 hours of sleep, he reports a" breathing issue"- he can't describe it. Phlegm or other wheezing is noted. He feels he cant breath in , no  difficulties to breath out.   REVIEW OF SYSTEMS: Out of a complete 14 system review of symptoms, the patient complains only of the following symptoms, none and all other reviewed systems are negative.  ESS: 5  FSS: not completed    ALLERGIES: Allergies  Allergen Reactions   Shrimp [Shellfish Allergy]     HOME MEDICATIONS: No outpatient medications prior to visit.   No facility-administered medications prior to visit.    PAST MEDICAL HISTORY: Past Medical History:  Diagnosis Date   Sleep apnea     PAST SURGICAL HISTORY: Past Surgical History:  Procedure Laterality Date   ANKLE SURGERY      FAMILY HISTORY: Family History  Problem Relation Age of Onset   Sudden death Mother    Heart attack Mother    Heart failure Mother    Hypertension Father    Hyperlipidemia Neg Hx    Diabetes Neg Hx     SOCIAL HISTORY: Social History   Socioeconomic History   Marital status: Single    Spouse name: Not on file   Number of children: Not on file    Years of education: Not on file   Highest education level: Not on file  Occupational History   Not on file  Tobacco Use   Smoking status: Every Day    Packs/day: 1.00    Pack years: 0.00    Types: Cigarettes   Smokeless tobacco: Never  Vaping Use   Vaping Use: Former  Substance and Sexual Activity   Alcohol use: Yes    Comment: socially   Drug use: No   Sexual activity: Not on file  Other Topics Concern   Not on file  Social History Narrative   Not on file   Social Determinants of Health   Financial Resource Strain: Not on file  Food Insecurity: Not on file  Transportation Needs: Not on file  Physical Activity: Not on file  Stress: Not on file  Social Connections: Not on file  Intimate Partner Violence: Not on file     PHYSICAL EXAM  Vitals:   05/30/21 0958  BP: (!) 153/96  Pulse: 95  Weight: (!) 394 lb (178.7 kg)  Height: _0  (1.88 m)   Body mass index is 50.59 kg/m.  Generalized: Well developed, in no acute distress  Cardiology: normal rate and rhythm, no murmur noted Respiratory: clear to auscultation bilaterally  Neurological examination  Mentation: Alert oriented to time, place, history taking. Follows all commands speech and language fluent Cranial nerve II-XII: Pupils were equal round reactive to light. Extraocular movements were full, visual field were full  Motor: The motor testing reveals 5 over 5 strength of all 4 extremities. Good symmetric motor tone is noted throughout.  Gait and station: Gait is normal.    DIAGNOSTIC DATA (LABS, IMAGING, TESTING) - I reviewed patient records, labs, notes, testing and imaging myself where available.  No flowsheet data found.   Lab Results  Component Value Date   WBC 14.7 (H) 11/27/2010   HGB 16.6 11/27/2010   HCT 47.0 11/27/2010   MCV 89.5 11/27/2010   PLT 234 11/27/2010      Component Value Date/Time   NA 135 11/27/2010 2140   K 3.8 11/27/2010 2140   CL 102 11/27/2010 2140   CO2 24  11/27/2010 2140   GLUCOSE 98 11/27/2010 2140   BUN 17 11/27/2010 2140   CREATININE 1.24 11/27/2010 2140   CALCIUM 9.4 11/27/2010 2140   GFRNONAA >60 11/27/2010 2140   GFRAA  11/27/2010 2140    >60        The eGFR has been calculated using the MDRD equation. This calculation has not been validated in all clinical situations. eGFR's persistently <60 mL/min  signify possible Chronic Kidney Disease.   No results found for: CHOL, HDL, LDLCALC, LDLDIRECT, TRIG, CHOLHDL No results found for: HGBA1C No results found for: VITAMINB12 No results found for: TSH   ASSESSMENT AND PLAN 45 y.o. year old male  has a past medical history of Sleep apnea. here with     ICD-10-CM   1. OSA treated with BiPAP  G47.33 For home use only DME continuous positive airway pressure (CPAP)    2. Chronic intermittent hypoxia with obstructive sleep apnea  G47.34 Pulse oximetry, overnight   G47.33        MORELL MEARS is doing well on BiPAP therapy. Compliance report reveals acceptable daily compliance with sub optimal but improving 4 hour compliance. He was encouraged to continue using BiPAP nightly and for greater than 4 hours each night. I will order a mask refitting for elevated leak. I will also order pulse oximetry on BiPAP as recommended by Dr Dohmeier at last visit but not performed. I will reassess leak in 6 weeks. Risks of untreated sleep apnea review and education materials provided. Healthy lifestyle habits encouraged. He will follow up in 6 months, sooner if needed. He verbalizes understanding and agreement with this plan.    Orders Placed This Encounter  Procedures   For home use only DME continuous positive airway pressure (CPAP)    Mask refitting please, has large leak with F20 FFM large.    Order Specific Question:   Length of Need    Answer:   Lifetime    Order Specific Question:   Patient has OSA or probable OSA    Answer:   Yes    Order Specific Question:   Is the patient currently  using CPAP in the home    Answer:   Yes    Order Specific Question:   Settings    Answer:   Other see comments    Order Specific Question:   CPAP supplies needed    Answer:   Mask, headgear, cushions, filters, heated tubing and water chamber   Pulse oximetry, overnight    Needs ONO on BiPAP    Standing Status:   Future    Standing Expiration Date:   05/30/2022      No orders of the defined types were placed in this encounter.     Debbora Presto, FNP-C 05/30/2021, 10:36 AM [facility] [address] [place], [place] [address] [phone]

## 2021-06-07 DIAGNOSIS — G4733 Obstructive sleep apnea (adult) (pediatric): Secondary | ICD-10-CM | POA: Diagnosis not present

## 2021-07-17 IMAGING — DX DG FOOT COMPLETE 3+V*R*
3 series · 3 of 3 positions shown · non-contrast
Comparison: September 03, 2017

CLINICAL DATA: Fall, medial foot pain

EXAM:
RIGHT FOOT COMPLETE - 3+ VIEW

[foot obl]
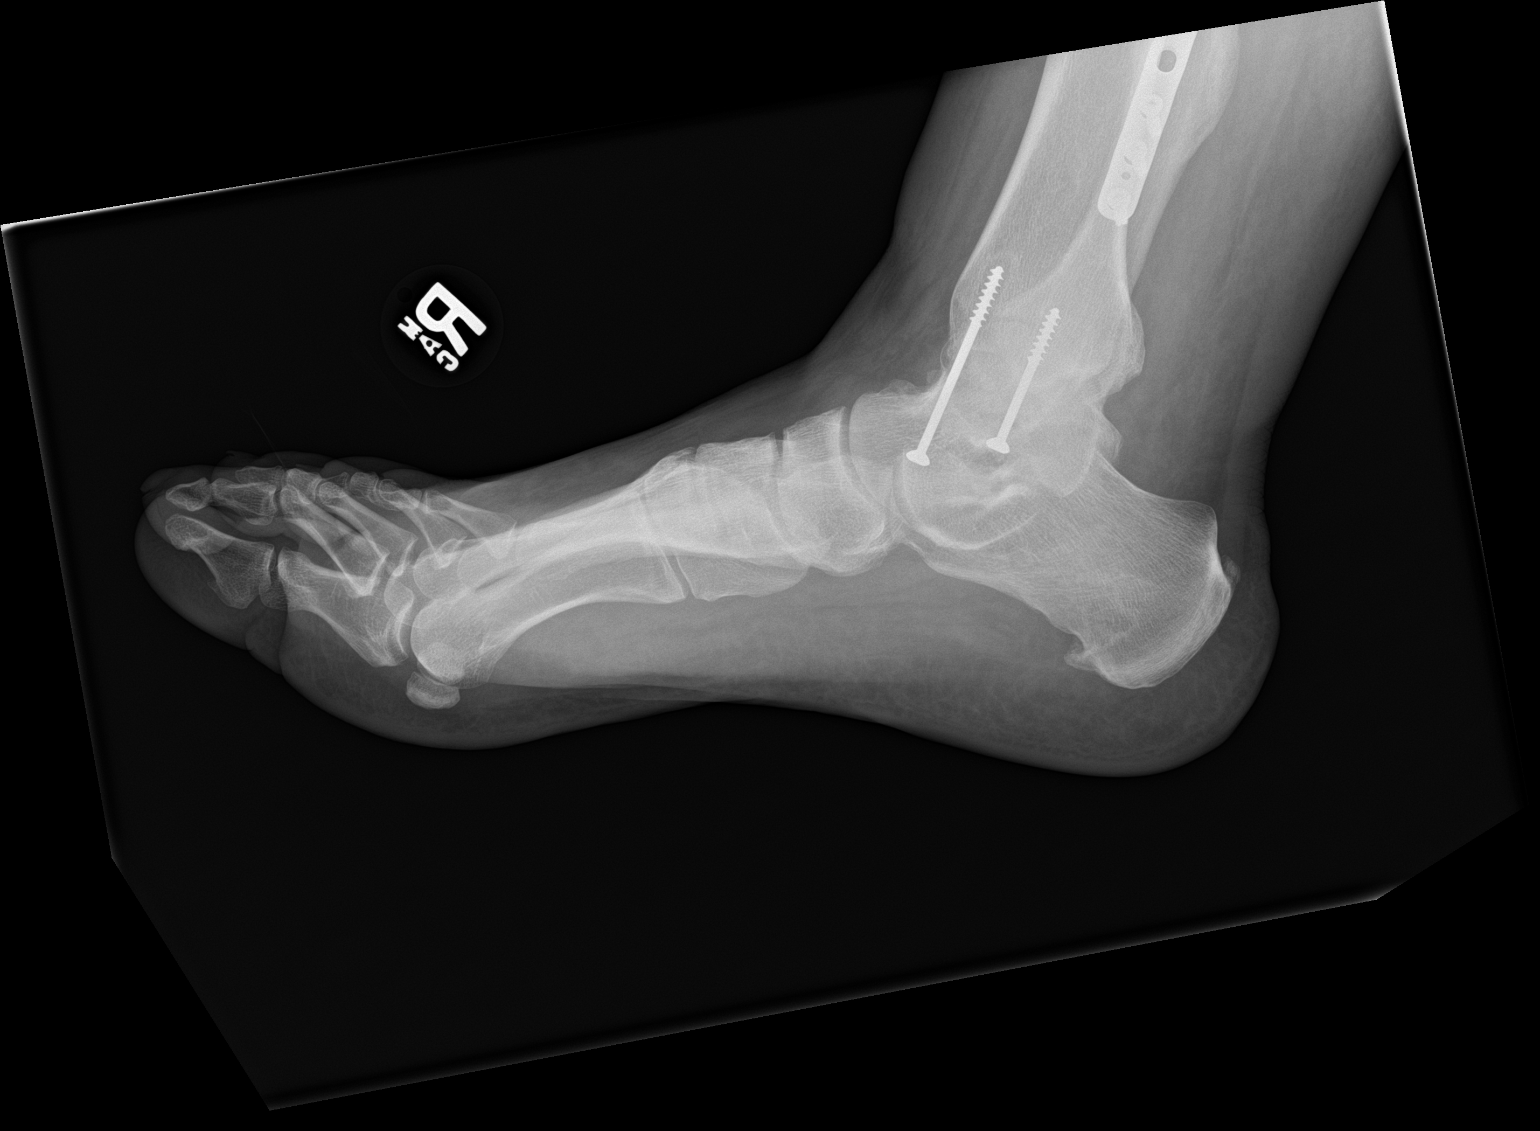

[foot lat]
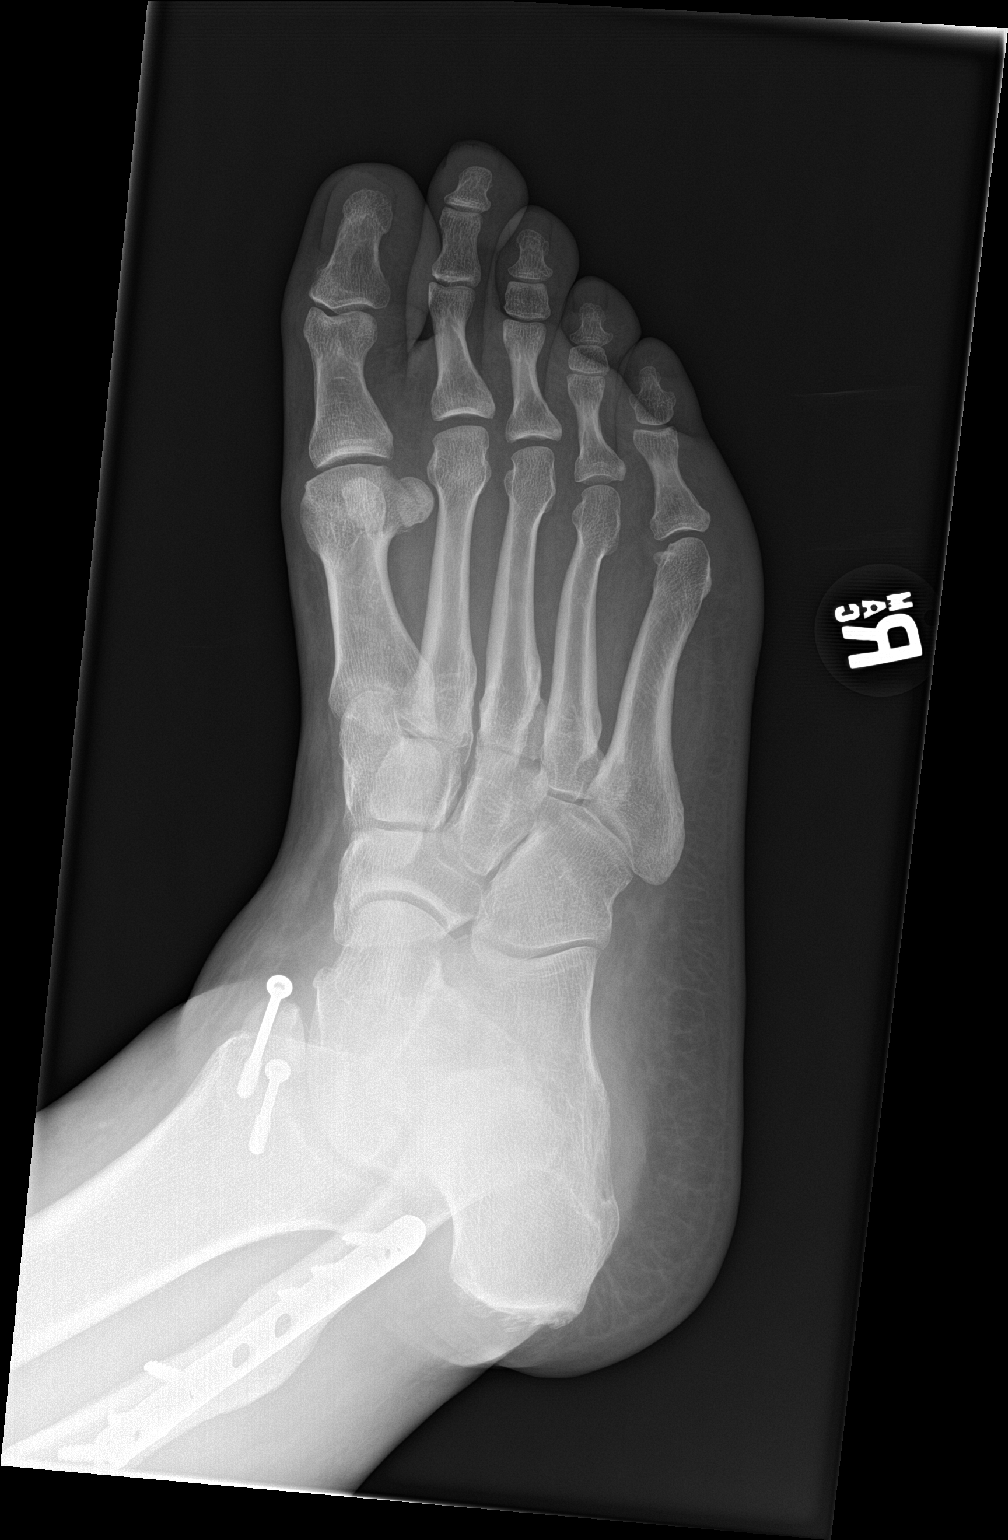

[foot ap]
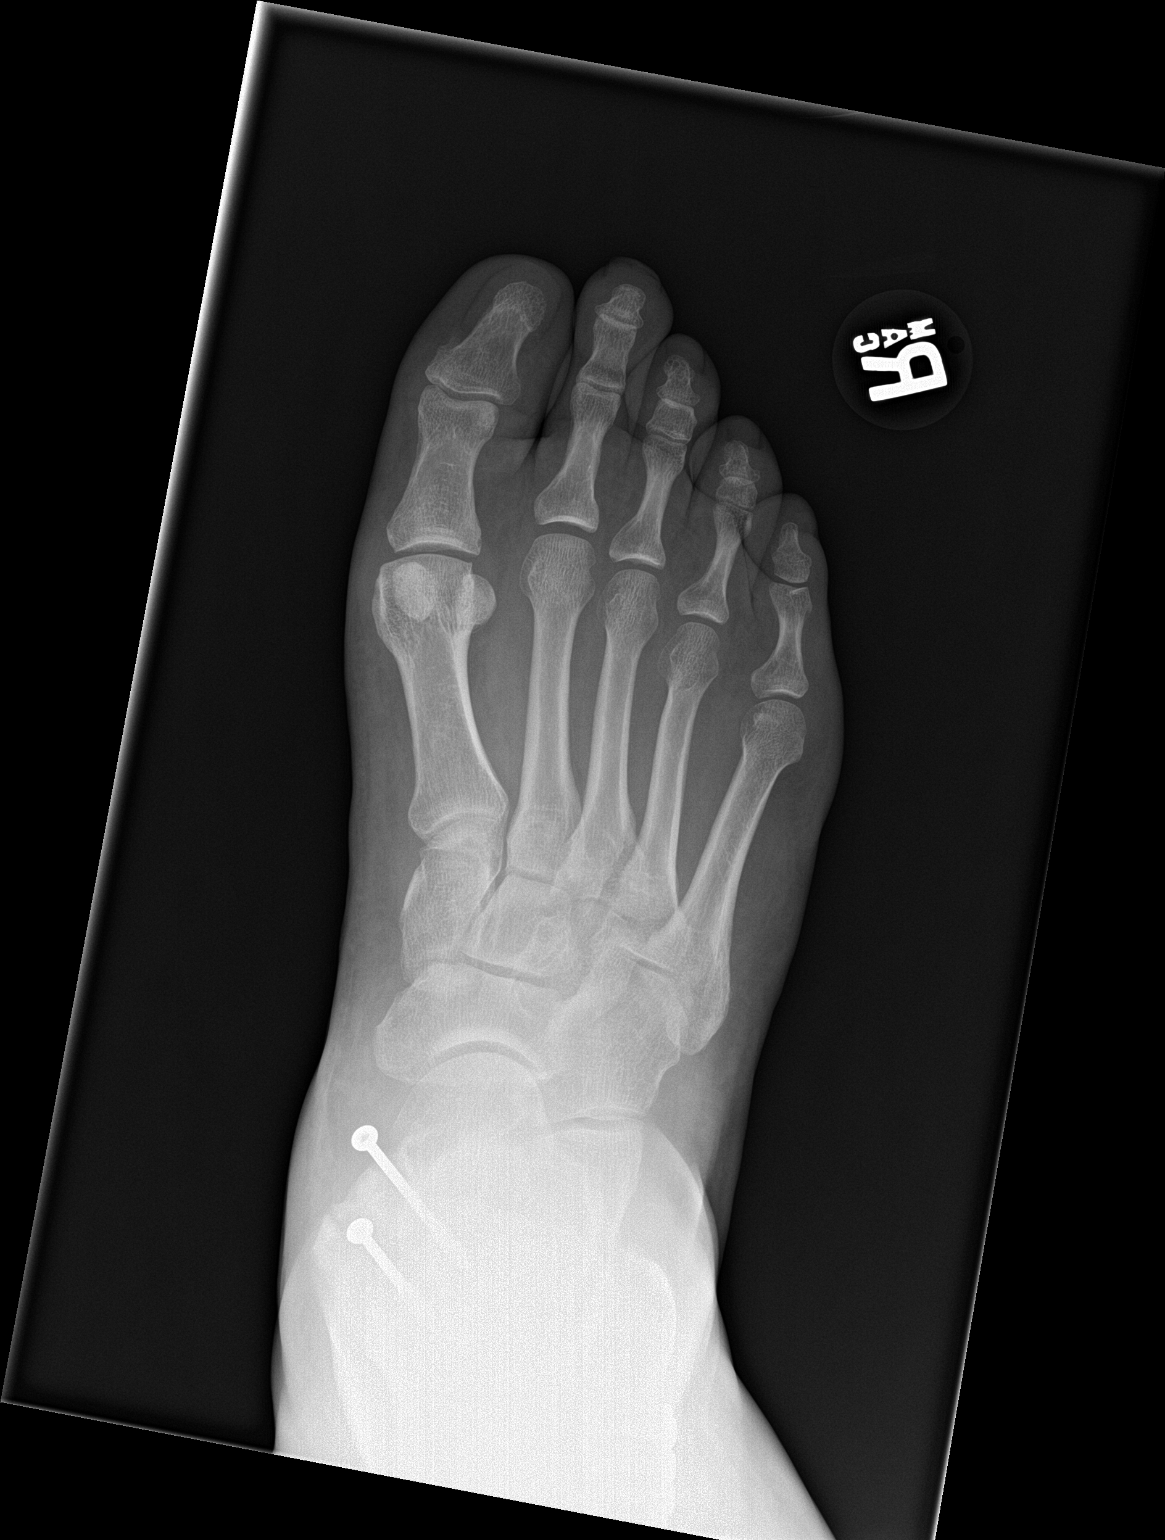

[3 of 3 positions shown; findings below may reference images not displayed]

FINDINGS: There is no evidence of fracture or dislocation. The patient is
status post ORIF with screw and plate fixation of the distal fibula
and medial malleolus. Mild dorsal soft tissue swelling is seen.
Small calcaneal enthesophytes are present.
IMPRESSION: No acute osseous abnormality.

## 2021-07-27 ENCOUNTER — Telehealth: Payer: Self-pay | Admitting: Family Medicine

## 2021-07-27 ENCOUNTER — Encounter: Payer: Self-pay | Admitting: Neurology

## 2021-07-27 NOTE — Telephone Encounter (Signed)
error 

## 2021-07-27 NOTE — Telephone Encounter (Signed)
Called the pt to discuss, there was no answer. Lvm advising I would send a mychart message. Advised he could call back or reply to mychart.

## 2021-07-27 NOTE — Telephone Encounter (Signed)
Please let him know that I have reviewed his CPAP data. Daily compliance looks good. 4 hour compliance is acceptable but we could increase compliance. The leak is better! It is still just a touch high but much better. Did he get new mask? His residual AHI remains around 7. If he is feeling well, we will continue to monitor. If he is not well rested or having trouble with BiPAP, I may see if we can get him in with Dr Vickey Huger for closer review of settings. TY!

## 2021-09-06 DIAGNOSIS — G4733 Obstructive sleep apnea (adult) (pediatric): Secondary | ICD-10-CM | POA: Diagnosis not present

## 2021-11-23 ENCOUNTER — Telehealth: Payer: Self-pay

## 2021-11-23 NOTE — Progress Notes (Deleted)
PATIENT: Tyler Briggs DOB: August 02, 1976  REASON FOR VISIT: follow up HISTORY FROM: patient  No chief complaint on file.    HISTORY OF PRESENT ILLNESS: 11/23/21 ALL: Tyler Briggs returns for follow up for OSA on BiPAP. He did get a new mask and feels it is working better for him.   Last data 08/2021  05/30/2021 ALL:  Tyler Briggs is a 45 y.o. male here today for follow up for OSA on BiPAP. He is resting better with BiPAP. He is more energized during the day and does not usually have to take a nap. He has not had any difficulty with machine or supplies. He is no longer waking up at night with breathing difficulty. He does note air leaking form the mask. Currently using Resmed F20 FFM large. He would like to try mask refitting. He admits that he does not always get 4 hours of sleep. 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       HISTORY: (copied from Dr Dohmeier's previous note)  Tyler Briggs is a 45 year old caucasian and Native American  male patient seen on 01/31/2021 : I have the pleasure of meeting with Tyler. Briggs today in the presence of his wife following multiple sleep studies.  The patient had the first sleep study with [place] at the baseline polysomnogram performed on 12-20 2020.  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  There was not much of a positional component or REM versus non-REM component component 633 times he woke up due to these respiratory events. The patient was not split also had ordered a split study he had to return on 5 February at that time we could not completely titrate him he started with a ResMed F 20 fullface mask and large size and reached no conclusion on the CPAP AutoSet titration had already failed and he was now treated 2 x 2 to BiPAP at 15/20 cmH2O.  A repeat BiPAP full night titration revealed the best pressure at 21/16 cmH2O  with a residual AHI of 8.6 no associated PLM activity he was able to reach REM sleep but severe hypoxemia persisted.  So there was a very early REM sleep noted which also was an abnormal finding.   He is now using the above named BiPAP modality: actually he uses 21/15 cmH2O with easy breeze function, the  residual AHI is 5.9 today compliance by 80% hourly compliance is by 17% only average use at time is 3 hours 12 minutes.  There is no significant air leakage, the 95th percentile leak is 7.4 L which is mild.   His Epworth sleepiness score has decreased to 11 points from previously 18 his fatigue severity is much decreased as well, nocturia times one form 5 times !!!   So our goal today is to increase his daily use of CPAP to 4 hours or beyond.   Tyler Briggs takes the mask off after about 3 hours of sleep, he reports a" breathing issue"- he can't describe it. Phlegm or other wheezing is noted. He feels he cant breath in , no  difficulties to breath out.   REVIEW OF SYSTEMS: Out of a complete 14 system review of symptoms, the patient complains only of the following symptoms, none and all other reviewed systems are negative.  ESS: 5 FSS: not completed  ALLERGIES: Allergies  Allergen Reactions   Shrimp [Shellfish Allergy]     HOME MEDICATIONS: No outpatient medications prior to visit.   No facility-administered medications prior to visit.    PAST MEDICAL HISTORY: Past Medical History:  Diagnosis Date   Sleep apnea     PAST SURGICAL HISTORY: Past Surgical History:  Procedure Laterality Date   ANKLE SURGERY      FAMILY HISTORY: Family History  Problem Relation Age of Onset   Sudden death Mother    Heart attack Mother    Heart failure Mother    Hypertension Father    Hyperlipidemia Neg Hx    Diabetes Neg Hx     SOCIAL HISTORY: Social History   Socioeconomic History   Marital status: Single    Spouse name: Not on file   Number of children: Not on file   Years of  education: Not on file   Highest education level: Not on file  Occupational History   Not on file  Tobacco Use   Smoking status: Every Day    Packs/day: 1.00    Types: Cigarettes   Smokeless tobacco: Never  Vaping Use   Vaping Use: Former  Substance and Sexual Activity   Alcohol use: Yes    Comment: socially   Drug use: No   Sexual activity: Not on file  Other Topics Concern   Not on file  Social History Narrative   Not on file   Social Determinants of Health   Financial Resource Strain: Not on file  Food Insecurity: Not on file  Transportation Needs: Not on file  Physical Activity: Not on file  Stress: Not on file  Social Connections: Not on file  Intimate Partner Violence: Not on file     PHYSICAL EXAM  There were no vitals filed for this visit.  There is no height or weight on file to calculate BMI.  Generalized: Well developed, in no acute distress  Cardiology: normal rate and rhythm, no murmur noted Respiratory: clear to auscultation bilaterally  Neurological examination  Mentation: Alert oriented to time, place, history taking. Follows all commands speech and language fluent Cranial nerve II-XII: Pupils were equal round reactive to light. Extraocular movements were full, visual field were full  Motor: The motor testing reveals 5 over 5 strength of all 4 extremities. Good symmetric motor tone is noted throughout.  Gait and station: Gait is normal.    DIAGNOSTIC DATA (LABS, IMAGING, TESTING) - I reviewed patient records, labs, notes, testing and imaging myself where available.  No flowsheet data found.   Lab Results  Component Value Date   WBC 14.7 (H) 11/27/2010   HGB 16.6 11/27/2010   HCT 47.0 11/27/2010   MCV 89.5 11/27/2010   PLT 234 11/27/2010      Component Value Date/Time   NA 135 11/27/2010 2140   K 3.8 11/27/2010 2140   CL 102 11/27/2010 2140   CO2 24 11/27/2010 2140   GLUCOSE 98 11/27/2010 2140   BUN 17 11/27/2010 2140   CREATININE  1.24 11/27/2010 2140   CALCIUM 9.4 11/27/2010 2140   GFRNONAA >60 11/27/2010 2140   GFRAA  11/27/2010 2140    >60        The eGFR has been calculated using the MDRD equation. This calculation has not been validated in all clinical situations. eGFR's persistently <60 mL/min signify possible Chronic Kidney Disease.   No results found for: CHOL, HDL, LDLCALC, LDLDIRECT, TRIG, CHOLHDL No results found for: HGBA1C No results found  for: VITAMINB12 No results found for: TSH   ASSESSMENT AND PLAN 45 y.o. year old male  has a past medical history of Sleep apnea. here with   No diagnosis found.    Tyler Briggs is doing well on BiPAP therapy. Compliance report reveals acceptable daily compliance with sub optimal but improving 4 hour compliance. He was encouraged to continue using BiPAP nightly and for greater than 4 hours each night. I will order a mask refitting for elevated leak. I will also order pulse oximetry on BiPAP as recommended by Dr Dohmeier at last visit but not performed. I will reassess leak in 6 weeks. Risks of untreated sleep apnea review and education materials provided. Healthy lifestyle habits encouraged. He will follow up in 6 months, sooner if needed. He verbalizes understanding and agreement with this plan.    No orders of the defined types were placed in this encounter.     No orders of the defined types were placed in this encounter.     Debbora Presto, FNP-C 11/23/2021, 9:40 AM [facility] [address], [place], [place] [address] [phone]

## 2021-11-23 NOTE — Patient Instructions (Incomplete)
Please continue using your BiPAP regularly. While your insurance requires that you use BiPAP at least 4 hours each night on 70% of the nights, I recommend, that you not skip any nights and use it throughout the night if you can. Getting used to BiPAP and staying with the treatment long term does take time and patience and discipline. Untreated obstructive sleep apnea when it is moderate to severe can have an adverse impact on cardiovascular health and raise her risk for heart disease, arrhythmias, hypertension, congestive heart failure, stroke and diabetes. Untreated obstructive sleep apnea causes sleep disruption, nonrestorative sleep, and sleep deprivation. This can have an impact on your day to day functioning and cause daytime sleepiness and impairment of cognitive function, memory loss, mood disturbance, and problems focussing. Using BiPAP regularly can improve these symptoms.   

## 2021-11-23 NOTE — Telephone Encounter (Signed)
LVM for pt to bring CPAP to appt, unable to find recent data.  °

## 2021-11-27 ENCOUNTER — Encounter: Payer: Self-pay | Admitting: Family Medicine

## 2021-11-27 ENCOUNTER — Ambulatory Visit: Payer: 59 | Admitting: Family Medicine

## 2022-06-07 ENCOUNTER — Encounter: Payer: Self-pay | Admitting: *Deleted

## 2023-10-08 ENCOUNTER — Encounter (HOSPITAL_COMMUNITY): Payer: Self-pay

## 2023-10-08 ENCOUNTER — Other Ambulatory Visit: Payer: Self-pay

## 2023-10-08 ENCOUNTER — Emergency Department (HOSPITAL_COMMUNITY)
Admission: EM | Admit: 2023-10-08 | Discharge: 2023-10-09 | Disposition: A | Discharge status: Home / Self Care | Payer: 59 | Attending: Emergency Medicine | Admitting: Emergency Medicine

## 2023-10-08 DIAGNOSIS — I872 Venous insufficiency (chronic) (peripheral): Secondary | ICD-10-CM | POA: Insufficient documentation

## 2023-10-08 LAB — CBC WITH DIFFERENTIAL/PLATELET
Abs Immature Granulocytes: 0.07 10*3/uL (ref 0.00–0.07)
Basophils Absolute: 0.1 10*3/uL (ref 0.0–0.1)
Basophils Relative: 0 %
Eosinophils Absolute: 0.2 10*3/uL (ref 0.0–0.5)
Eosinophils Relative: 2 %
HCT: 40.6 % (ref 39.0–52.0)
Hemoglobin: 13.8 g/dL (ref 13.0–17.0)
Immature Granulocytes: 1 %
Lymphocytes Relative: 13 %
Lymphs Abs: 1.7 10*3/uL (ref 0.7–4.0)
MCH: 30.3 pg (ref 26.0–34.0)
MCHC: 34 g/dL (ref 30.0–36.0)
MCV: 89.2 fL (ref 80.0–100.0)
Monocytes Absolute: 1.2 10*3/uL — ABNORMAL HIGH (ref 0.1–1.0)
Monocytes Relative: 9 %
Neutro Abs: 10.2 10*3/uL — ABNORMAL HIGH (ref 1.7–7.7)
Neutrophils Relative %: 75 %
Platelets: 384 10*3/uL (ref 150–400)
RBC: 4.55 MIL/uL (ref 4.22–5.81)
RDW: 12.9 % (ref 11.5–15.5)
WBC: 13.4 10*3/uL — ABNORMAL HIGH (ref 4.0–10.5)
nRBC: 0 % (ref 0.0–0.2)

## 2023-10-08 MED ORDER — ACETAMINOPHEN 325 MG PO TABS
650.0000 mg | ORAL_TABLET | Freq: Once | ORAL | Status: AC
  Administered 2023-10-08: 650 mg via ORAL
  Filled 2023-10-08: qty 2

## 2023-10-08 NOTE — ED Triage Notes (Signed)
Pt presents via EMS c/o cellulitis to bilateral legs. Reports this is a recurrent issue for him.

## 2023-10-09 ENCOUNTER — Other Ambulatory Visit (HOSPITAL_BASED_OUTPATIENT_CLINIC_OR_DEPARTMENT_OTHER): Payer: Self-pay

## 2023-10-09 LAB — COMPREHENSIVE METABOLIC PANEL
ALT: 29 U/L (ref 0–44)
AST: 25 U/L (ref 15–41)
Albumin: 3.1 g/dL — ABNORMAL LOW (ref 3.5–5.0)
Alkaline Phosphatase: 69 U/L (ref 38–126)
Anion gap: 8 (ref 5–15)
BUN: 18 mg/dL (ref 6–20)
CO2: 22 mmol/L (ref 22–32)
Calcium: 8.8 mg/dL — ABNORMAL LOW (ref 8.9–10.3)
Chloride: 101 mmol/L (ref 98–111)
Creatinine, Ser: 0.87 mg/dL (ref 0.61–1.24)
GFR, Estimated: >60 mL/min (ref >60)
Glucose, Bld: 170 mg/dL — ABNORMAL HIGH (ref 70–99)
Potassium: 3.8 mmol/L (ref 3.5–5.1)
Sodium: 131 mmol/L — ABNORMAL LOW (ref 135–145)
Total Bilirubin: 0.5 mg/dL (ref 0.3–1.2)
Total Protein: 7.9 g/dL (ref 6.5–8.1)

## 2023-10-09 MED ORDER — CHLORHEXIDINE GLUCONATE 4 % EX SOLN
Freq: Every day | CUTANEOUS | 0 refills | Status: DC
  Filled 2023-10-09: qty 946, fill #0

## 2023-10-09 MED ORDER — MUPIROCIN CALCIUM 2 % EX CREA
1.0000 | TOPICAL_CREAM | Freq: Two times a day (BID) | CUTANEOUS | 0 refills | Status: DC
  Filled 2023-10-09: qty 15, 8d supply, fill #0

## 2023-10-09 MED ORDER — DOXYCYCLINE HYCLATE 100 MG PO CAPS: 100.0000 mg | ORAL_CAPSULE | Freq: Two times a day (BID) | ORAL | 0 refills | Status: AC

## 2023-10-09 MED ORDER — MUPIROCIN CALCIUM 2 % EX CREA: 1.0000 | TOPICAL_CREAM | Freq: Two times a day (BID) | CUTANEOUS | 0 refills | Status: AC

## 2023-10-09 MED ORDER — CHLORHEXIDINE GLUCONATE 4 % EX SOLN: Freq: Every day | CUTANEOUS | 0 refills | Status: AC

## 2023-10-09 MED ORDER — DOXYCYCLINE HYCLATE 100 MG PO CAPS
100.0000 mg | ORAL_CAPSULE | Freq: Two times a day (BID) | ORAL | 0 refills | Status: DC
  Filled 2023-10-09: qty 20, 10d supply, fill #0

## 2023-10-09 NOTE — ED Provider Notes (Signed)
[place] EMERGENCY DEPARTMENT AT [facility] Provider Note   CSN: [id number] Arrival date & time: 10/08/23  2322     History  Chief Complaint  Patient presents with   Leg Pain    Tyler Briggs is a 47 y.o. male.  Patient presents to the emergency department complaining of bilateral leg inflammation with pain in the left lower extremity.  Patient states has been ongoing for some time.  He has not been evaluated by primary care for the same. Pain is described as burning in nature. Patient denies known fever, nausea, vomiting, recent travel, shortness of breath, chest pain past medical history is significant for low back pain, tobacco abuse, obesity, obstructive sleep apnea with chronic intermittent hypoxia   Leg Pain      Home Medications Prior to Admission medications   Medication Sig Start Date End Date Taking? Authorizing Provider  chlorhexidine (HIBICLENS) 4 % external liquid Apply topically daily. Apply to both legs 10/09/23   Barrie Dunker B, PA-C  doxycycline (VIBRAMYCIN) 100 MG capsule Take 1 capsule (100 mg total) by mouth 2 (two) times daily. 10/09/23   Darrick Grinder, PA-C  mupirocin cream (BACTROBAN) 2 % Apply 1 Application topically 2 (two) times daily. 10/09/23   Darrick Grinder, PA-C      Allergies    Shrimp [shellfish allergy]    Review of Systems   Review of Systems  Physical Exam Updated Vital Signs BP (!) 151/97   Pulse 98   Temp 99.3 F (37.4 C) (Oral)   Resp 20   SpO2 94%  Physical Exam Vitals and nursing note reviewed.  HENT:     Head: Normocephalic and atraumatic.  Eyes:     Pupils: Pupils are equal, round, and reactive to light.  Pulmonary:     Effort: Pulmonary effort is normal. No respiratory distress.  Musculoskeletal:        General: Tenderness present. No signs of injury.     Cervical back: Normal range of motion.  Skin:    General: Skin is dry.     Comments: Some ulcerations noted to bilateral lower  extremities with skin changes consistent with venous stasis dermatitis.  See attached images  Neurological:     Mental Status: He is alert.  Psychiatric:        Speech: Speech normal.        Behavior: Behavior normal.        ED Results / Procedures / Treatments   Labs (all labs ordered are listed, but only abnormal results are displayed) Labs Reviewed  COMPREHENSIVE METABOLIC PANEL - Abnormal; Notable for the following components:      Result Value   Sodium 131 (*)    Glucose, Bld 170 (*)    Calcium 8.8 (*)    Albumin 3.1 (*)    All other components within normal limits  CBC WITH DIFFERENTIAL/PLATELET - Abnormal; Notable for the following components:   WBC 13.4 (*)    Neutro Abs 10.2 (*)    Monocytes Absolute 1.2 (*)    All other components within normal limits    EKG None  Radiology No results found.  Procedures Procedures    Medications Ordered in ED Medications  acetaminophen (TYLENOL) tablet 650 mg (650 mg Oral Given 10/08/23 2343)    ED Course/ Medical Decision Making/ A&P  Medical Decision Making Amount and/or Complexity of Data Reviewed Labs: ordered.  Risk OTC drugs. Prescription drug management.   This patient presents to the ED for concern of bilateral leg wounds, this involves an extensive number of treatment options, and is a complaint that carries with it a high risk of complications and morbidity.  The differential diagnosis includes venous stasis dermatitis, cellulitis, DVT, others   Co morbidities that complicate the patient evaluation  Obesity   Additional history obtained:  Additional history obtained from EMS  Lab Tests:  I Ordered, and personally interpreted labs.  The pertinent results include: WBC 13.4   Test / Admission - Considered:  Patient presentation consistent with venous stasis dermatitis.  Denna Haggard' sign negative.  No calf swelling when compared to contralateral side. Patient with  no recent travel, surgery.  Well score -1.  Presentation not consistent with DVT.  No shortness of breath.  Plan to discharge home with prescription for doxycycline, Hibiclens, mupirocin, recommendation for follow-up with primary care.  Return precautions provided.         Final Clinical Impression(s) / ED Diagnoses Final diagnoses:  Venous stasis dermatitis of both lower extremities    Rx / DC Orders ED Discharge Orders          Ordered    doxycycline (VIBRAMYCIN) 100 MG capsule  2 times daily,   Status:  Discontinued        10/09/23 0333    chlorhexidine (HIBICLENS) 4 % external liquid  Daily,   Status:  Discontinued        10/09/23 0333    mupirocin cream (BACTROBAN) 2 %  2 times daily,   Status:  Discontinued        10/09/23 0333    mupirocin cream (BACTROBAN) 2 %  2 times daily        10/09/23 0421    doxycycline (VIBRAMYCIN) 100 MG capsule  2 times daily        10/09/23 0421    chlorhexidine (HIBICLENS) 4 % external liquid  Daily        10/09/23 0421              Pamala Duffel 10/09/23 Vicie Mutters, April, MD 10/09/23 0502

## 2023-10-09 NOTE — Discharge Instructions (Signed)
Please take the prescribed medications and follow up with your primary care.  Your presentation is consistent with venous stasis dermatitis.  If you develop any life-threatening symptoms please return to the emergency department.
